# Patient Record
Sex: Male | Born: 1950 | Race: White | Hispanic: No | Marital: Married | Smoking: Current every day smoker
Health system: Southern US, Community
[De-identification: ages and names within clinical notes are randomized; demographics above are authoritative.]

## PROBLEM LIST (undated history)

## (undated) DIAGNOSIS — I219 Acute myocardial infarction, unspecified: Secondary | ICD-10-CM

## (undated) DIAGNOSIS — T148XXA Other injury of unspecified body region, initial encounter: Secondary | ICD-10-CM

## (undated) DIAGNOSIS — I10 Essential (primary) hypertension: Secondary | ICD-10-CM

## (undated) DIAGNOSIS — R011 Cardiac murmur, unspecified: Secondary | ICD-10-CM

## (undated) DIAGNOSIS — E7800 Pure hypercholesterolemia, unspecified: Secondary | ICD-10-CM

## (undated) DIAGNOSIS — E039 Hypothyroidism, unspecified: Secondary | ICD-10-CM

## (undated) DIAGNOSIS — M1990 Unspecified osteoarthritis, unspecified site: Secondary | ICD-10-CM

## (undated) DIAGNOSIS — E119 Type 2 diabetes mellitus without complications: Secondary | ICD-10-CM

## (undated) DIAGNOSIS — J449 Chronic obstructive pulmonary disease, unspecified: Secondary | ICD-10-CM

## (undated) DIAGNOSIS — I2510 Atherosclerotic heart disease of native coronary artery without angina pectoris: Secondary | ICD-10-CM

## (undated) HISTORY — PX: JOINT REPLACEMENT: SHX530

## (undated) HISTORY — PX: APPENDECTOMY: SHX54

## (undated) HISTORY — PX: EYE SURGERY: SHX253

## (undated) HISTORY — PX: CORONARY ARTERY BYPASS GRAFT: SHX141

## (undated) HISTORY — PX: HERNIA REPAIR: SHX51

## (undated) HISTORY — PX: CARDIAC VALVE REPLACEMENT: SHX585

## (undated) HISTORY — PX: TONSILLECTOMY: SUR1361

## (undated) HISTORY — PX: CATARACT EXTRACTION: SUR2

## (undated) HISTORY — PX: CAROTID ENDARTERECTOMY: SUR193

## (undated) HISTORY — PX: CARDIAC CATHETERIZATION: SHX172

---

## 2012-02-13 ENCOUNTER — Encounter (INDEPENDENT_AMBULATORY_CARE_PROVIDER_SITE_OTHER): Payer: Medicare Other | Admitting: Ophthalmology

## 2012-02-13 DIAGNOSIS — E1165 Type 2 diabetes mellitus with hyperglycemia: Secondary | ICD-10-CM

## 2012-02-13 DIAGNOSIS — H2510 Age-related nuclear cataract, unspecified eye: Secondary | ICD-10-CM

## 2012-02-13 DIAGNOSIS — I10 Essential (primary) hypertension: Secondary | ICD-10-CM

## 2012-02-13 DIAGNOSIS — E11359 Type 2 diabetes mellitus with proliferative diabetic retinopathy without macular edema: Secondary | ICD-10-CM

## 2012-02-13 DIAGNOSIS — H35039 Hypertensive retinopathy, unspecified eye: Secondary | ICD-10-CM

## 2012-02-13 DIAGNOSIS — E11319 Type 2 diabetes mellitus with unspecified diabetic retinopathy without macular edema: Secondary | ICD-10-CM

## 2012-02-13 DIAGNOSIS — H43819 Vitreous degeneration, unspecified eye: Secondary | ICD-10-CM

## 2012-02-17 ENCOUNTER — Encounter (HOSPITAL_COMMUNITY): Admission: RE | Payer: Self-pay | Source: Ambulatory Visit

## 2012-02-17 ENCOUNTER — Ambulatory Visit (HOSPITAL_COMMUNITY): Admission: RE | Admit: 2012-02-17 | Payer: Medicare Other | Source: Ambulatory Visit | Admitting: Ophthalmology

## 2012-02-17 SURGERY — SCLERAL BUCKLE
Anesthesia: General | Laterality: Left

## 2012-06-16 ENCOUNTER — Ambulatory Visit (INDEPENDENT_AMBULATORY_CARE_PROVIDER_SITE_OTHER): Payer: Medicare Other | Admitting: Ophthalmology

## 2012-06-16 DIAGNOSIS — E11359 Type 2 diabetes mellitus with proliferative diabetic retinopathy without macular edema: Secondary | ICD-10-CM

## 2012-06-16 DIAGNOSIS — H35039 Hypertensive retinopathy, unspecified eye: Secondary | ICD-10-CM

## 2012-06-16 DIAGNOSIS — E1165 Type 2 diabetes mellitus with hyperglycemia: Secondary | ICD-10-CM

## 2012-06-16 DIAGNOSIS — E11319 Type 2 diabetes mellitus with unspecified diabetic retinopathy without macular edema: Secondary | ICD-10-CM

## 2012-06-16 DIAGNOSIS — I10 Essential (primary) hypertension: Secondary | ICD-10-CM

## 2012-06-16 DIAGNOSIS — H2510 Age-related nuclear cataract, unspecified eye: Secondary | ICD-10-CM

## 2012-06-16 DIAGNOSIS — H43819 Vitreous degeneration, unspecified eye: Secondary | ICD-10-CM

## 2012-06-16 DIAGNOSIS — D3130 Benign neoplasm of unspecified choroid: Secondary | ICD-10-CM

## 2012-12-20 ENCOUNTER — Ambulatory Visit (INDEPENDENT_AMBULATORY_CARE_PROVIDER_SITE_OTHER): Payer: Medicare Other | Admitting: Ophthalmology

## 2012-12-20 DIAGNOSIS — E11319 Type 2 diabetes mellitus with unspecified diabetic retinopathy without macular edema: Secondary | ICD-10-CM

## 2012-12-20 DIAGNOSIS — H35039 Hypertensive retinopathy, unspecified eye: Secondary | ICD-10-CM

## 2012-12-20 DIAGNOSIS — I10 Essential (primary) hypertension: Secondary | ICD-10-CM

## 2012-12-20 DIAGNOSIS — E1139 Type 2 diabetes mellitus with other diabetic ophthalmic complication: Secondary | ICD-10-CM

## 2012-12-20 DIAGNOSIS — E11359 Type 2 diabetes mellitus with proliferative diabetic retinopathy without macular edema: Secondary | ICD-10-CM

## 2012-12-20 DIAGNOSIS — D3130 Benign neoplasm of unspecified choroid: Secondary | ICD-10-CM

## 2013-05-10 ENCOUNTER — Encounter (INDEPENDENT_AMBULATORY_CARE_PROVIDER_SITE_OTHER): Payer: Medicare Other | Admitting: Ophthalmology

## 2013-05-10 DIAGNOSIS — D3130 Benign neoplasm of unspecified choroid: Secondary | ICD-10-CM

## 2013-05-10 DIAGNOSIS — H34239 Retinal artery branch occlusion, unspecified eye: Secondary | ICD-10-CM

## 2013-05-10 DIAGNOSIS — H35039 Hypertensive retinopathy, unspecified eye: Secondary | ICD-10-CM

## 2013-05-10 DIAGNOSIS — E11319 Type 2 diabetes mellitus with unspecified diabetic retinopathy without macular edema: Secondary | ICD-10-CM

## 2013-05-10 DIAGNOSIS — E1165 Type 2 diabetes mellitus with hyperglycemia: Secondary | ICD-10-CM

## 2013-05-10 DIAGNOSIS — I10 Essential (primary) hypertension: Secondary | ICD-10-CM

## 2013-05-10 DIAGNOSIS — E1139 Type 2 diabetes mellitus with other diabetic ophthalmic complication: Secondary | ICD-10-CM

## 2013-05-10 DIAGNOSIS — H26499 Other secondary cataract, unspecified eye: Secondary | ICD-10-CM

## 2013-05-20 ENCOUNTER — Ambulatory Visit (INDEPENDENT_AMBULATORY_CARE_PROVIDER_SITE_OTHER): Payer: Medicare Other | Admitting: Ophthalmology

## 2013-05-20 DIAGNOSIS — H34239 Retinal artery branch occlusion, unspecified eye: Secondary | ICD-10-CM

## 2013-05-20 DIAGNOSIS — H2700 Aphakia, unspecified eye: Secondary | ICD-10-CM

## 2013-06-20 ENCOUNTER — Ambulatory Visit (INDEPENDENT_AMBULATORY_CARE_PROVIDER_SITE_OTHER): Payer: Medicare Other | Admitting: Ophthalmology

## 2013-10-12 ENCOUNTER — Encounter (INDEPENDENT_AMBULATORY_CARE_PROVIDER_SITE_OTHER): Payer: Medicare Other | Admitting: Ophthalmology

## 2013-10-12 DIAGNOSIS — H35039 Hypertensive retinopathy, unspecified eye: Secondary | ICD-10-CM

## 2013-10-12 DIAGNOSIS — E11359 Type 2 diabetes mellitus with proliferative diabetic retinopathy without macular edema: Secondary | ICD-10-CM

## 2013-10-12 DIAGNOSIS — E1165 Type 2 diabetes mellitus with hyperglycemia: Secondary | ICD-10-CM

## 2013-10-12 DIAGNOSIS — I10 Essential (primary) hypertension: Secondary | ICD-10-CM

## 2013-10-12 DIAGNOSIS — H4310 Vitreous hemorrhage, unspecified eye: Secondary | ICD-10-CM

## 2013-10-12 DIAGNOSIS — E11319 Type 2 diabetes mellitus with unspecified diabetic retinopathy without macular edema: Secondary | ICD-10-CM

## 2013-10-12 DIAGNOSIS — E1139 Type 2 diabetes mellitus with other diabetic ophthalmic complication: Secondary | ICD-10-CM

## 2013-10-12 DIAGNOSIS — H43819 Vitreous degeneration, unspecified eye: Secondary | ICD-10-CM

## 2013-10-24 NOTE — H&P (Signed)
Christian Gibson is an 63 y.o. male.   Chief Complaint:loss of vision 2 weeks ago left eye HPI: Diabetic with vitreous hemorrhage two weeks ago,  Left eye  No past medical history on file.  No past surgical history on file.  No family history on file. Social History:  has no tobacco, alcohol, and drug history on file.  Allergies: Allergies not on file  No prescriptions prior to admission    Review of systems otherwise negative  There were no vitals taken for this visit.  Physical exam: Mental status: oriented x3. Eyes: See eye exam associated with this date of surgery in media tab.  Scanned in by scanning center Ears, Nose, Throat: within normal limits Neck: Within Normal limits General: within normal limits Chest: Within normal limits Breast: deferred Heart: Within normal limits Abdomen: Within normal limits GU: deferred Extremities: within normal limits Skin: within normal limits  Assessment/Plan Vitreous hemorrhage, left eye  Proliferative diabetic retinopathy left eye Plan: To [facility] for Pars plana vitrectomy, laser, membrane peel, gas injection left eye.  Christian Gibson 10/24/2013, 5:16 PM

## 2013-11-07 ENCOUNTER — Encounter (HOSPITAL_COMMUNITY): Payer: Self-pay | Admitting: Pharmacy Technician

## 2013-11-09 ENCOUNTER — Encounter (HOSPITAL_COMMUNITY): Payer: Self-pay | Admitting: *Deleted

## 2013-11-09 ENCOUNTER — Encounter (HOSPITAL_COMMUNITY): Payer: Self-pay | Admitting: Certified Registered Nurse Anesthetist

## 2013-11-09 MED ORDER — CEFAZOLIN SODIUM-DEXTROSE 2-3 GM-% IV SOLR: 2.0000 g | INTRAVENOUS | Status: DC

## 2013-11-09 NOTE — Progress Notes (Signed)
11/09/13 1809  OBSTRUCTIVE SLEEP APNEA  Have you ever been diagnosed with sleep apnea through a sleep study? No  Do you snore loudly (loud enough to be heard through closed doors)?  0  Do you often feel tired, fatigued, or sleepy during the daytime? 1  Has anyone observed you stop breathing during your sleep? 0  Do you have, or are you being treated for high blood pressure? 1  Age over 65 years old? 1  Gender: 1  Obstructive Sleep Apnea Score 4

## 2013-11-10 ENCOUNTER — Ambulatory Visit (HOSPITAL_COMMUNITY): Admission: RE | Admit: 2013-11-10 | Payer: Medicare Other | Source: Ambulatory Visit | Admitting: Ophthalmology

## 2013-11-10 ENCOUNTER — Encounter (INDEPENDENT_AMBULATORY_CARE_PROVIDER_SITE_OTHER): Payer: Medicare Other | Admitting: Ophthalmology

## 2013-11-10 DIAGNOSIS — H4312 Vitreous hemorrhage, left eye: Secondary | ICD-10-CM

## 2013-11-10 DIAGNOSIS — H2102 Hyphema, left eye: Secondary | ICD-10-CM

## 2013-11-10 DIAGNOSIS — H35033 Hypertensive retinopathy, bilateral: Secondary | ICD-10-CM

## 2013-11-10 DIAGNOSIS — E11351 Type 2 diabetes mellitus with proliferative diabetic retinopathy with macular edema: Secondary | ICD-10-CM

## 2013-11-10 DIAGNOSIS — E11329 Type 2 diabetes mellitus with mild nonproliferative diabetic retinopathy without macular edema: Secondary | ICD-10-CM

## 2013-11-10 DIAGNOSIS — H31412 Hemorrhagic choroidal detachment, left eye: Secondary | ICD-10-CM

## 2013-11-10 DIAGNOSIS — E11311 Type 2 diabetes mellitus with unspecified diabetic retinopathy with macular edema: Secondary | ICD-10-CM

## 2013-11-10 HISTORY — DX: Essential (primary) hypertension: I10

## 2013-11-10 HISTORY — DX: Acute myocardial infarction, unspecified: I21.9

## 2013-11-10 HISTORY — DX: Type 2 diabetes mellitus without complications: E11.9

## 2013-11-10 HISTORY — DX: Pure hypercholesterolemia, unspecified: E78.00

## 2013-11-10 HISTORY — DX: Atherosclerotic heart disease of native coronary artery without angina pectoris: I25.10

## 2013-11-10 HISTORY — DX: Unspecified osteoarthritis, unspecified site: M19.90

## 2013-11-10 HISTORY — DX: Cardiac murmur, unspecified: R01.1

## 2013-11-10 HISTORY — DX: Chronic obstructive pulmonary disease, unspecified: J44.9

## 2013-11-10 HISTORY — DX: Other injury of unspecified body region, initial encounter: T14.8XXA

## 2013-11-10 HISTORY — DX: Hypothyroidism, unspecified: E03.9

## 2013-11-10 SURGERY — PARS PLANA VITRECTOMY WITH 25 GAUGE
Anesthesia: General | Laterality: Left

## 2013-11-10 NOTE — H&P (Signed)
I examined the patient today and there is no change in the medical status 

## 2013-11-14 ENCOUNTER — Encounter (INDEPENDENT_AMBULATORY_CARE_PROVIDER_SITE_OTHER): Payer: Medicare Other | Admitting: Ophthalmology

## 2013-11-14 DIAGNOSIS — E11311 Type 2 diabetes mellitus with unspecified diabetic retinopathy with macular edema: Secondary | ICD-10-CM

## 2013-11-14 DIAGNOSIS — E11351 Type 2 diabetes mellitus with proliferative diabetic retinopathy with macular edema: Secondary | ICD-10-CM

## 2013-11-14 DIAGNOSIS — E11329 Type 2 diabetes mellitus with mild nonproliferative diabetic retinopathy without macular edema: Secondary | ICD-10-CM

## 2013-11-14 DIAGNOSIS — H2102 Hyphema, left eye: Secondary | ICD-10-CM

## 2013-11-14 DIAGNOSIS — I10 Essential (primary) hypertension: Secondary | ICD-10-CM

## 2013-11-14 DIAGNOSIS — H43813 Vitreous degeneration, bilateral: Secondary | ICD-10-CM

## 2013-11-14 DIAGNOSIS — H2511 Age-related nuclear cataract, right eye: Secondary | ICD-10-CM

## 2013-11-14 DIAGNOSIS — H35032 Hypertensive retinopathy, left eye: Secondary | ICD-10-CM

## 2013-11-14 DIAGNOSIS — H31412 Hemorrhagic choroidal detachment, left eye: Secondary | ICD-10-CM

## 2013-11-17 ENCOUNTER — Encounter (INDEPENDENT_AMBULATORY_CARE_PROVIDER_SITE_OTHER): Payer: Medicare Other | Admitting: Ophthalmology

## 2013-11-17 DIAGNOSIS — E11351 Type 2 diabetes mellitus with proliferative diabetic retinopathy with macular edema: Secondary | ICD-10-CM

## 2013-11-17 DIAGNOSIS — H4312 Vitreous hemorrhage, left eye: Secondary | ICD-10-CM

## 2013-11-17 DIAGNOSIS — E11311 Type 2 diabetes mellitus with unspecified diabetic retinopathy with macular edema: Secondary | ICD-10-CM

## 2013-11-17 DIAGNOSIS — E11329 Type 2 diabetes mellitus with mild nonproliferative diabetic retinopathy without macular edema: Secondary | ICD-10-CM

## 2013-11-17 DIAGNOSIS — H2102 Hyphema, left eye: Secondary | ICD-10-CM

## 2013-11-17 DIAGNOSIS — H31412 Hemorrhagic choroidal detachment, left eye: Secondary | ICD-10-CM

## 2013-11-17 DIAGNOSIS — H43813 Vitreous degeneration, bilateral: Secondary | ICD-10-CM

## 2013-11-21 ENCOUNTER — Ambulatory Visit (INDEPENDENT_AMBULATORY_CARE_PROVIDER_SITE_OTHER): Payer: Medicare Other | Admitting: Ophthalmology

## 2013-11-21 ENCOUNTER — Encounter (INDEPENDENT_AMBULATORY_CARE_PROVIDER_SITE_OTHER): Payer: Medicare Other | Admitting: Ophthalmology

## 2013-11-21 DIAGNOSIS — H43813 Vitreous degeneration, bilateral: Secondary | ICD-10-CM

## 2013-11-21 DIAGNOSIS — E11329 Type 2 diabetes mellitus with mild nonproliferative diabetic retinopathy without macular edema: Secondary | ICD-10-CM

## 2013-11-21 DIAGNOSIS — H4312 Vitreous hemorrhage, left eye: Secondary | ICD-10-CM

## 2013-11-21 DIAGNOSIS — E11311 Type 2 diabetes mellitus with unspecified diabetic retinopathy with macular edema: Secondary | ICD-10-CM

## 2013-11-21 DIAGNOSIS — H31412 Hemorrhagic choroidal detachment, left eye: Secondary | ICD-10-CM

## 2013-11-21 DIAGNOSIS — H2102 Hyphema, left eye: Secondary | ICD-10-CM

## 2013-11-21 DIAGNOSIS — H35033 Hypertensive retinopathy, bilateral: Secondary | ICD-10-CM

## 2013-11-21 DIAGNOSIS — E11351 Type 2 diabetes mellitus with proliferative diabetic retinopathy with macular edema: Secondary | ICD-10-CM

## 2013-11-24 ENCOUNTER — Encounter (INDEPENDENT_AMBULATORY_CARE_PROVIDER_SITE_OTHER): Payer: Medicare Other | Admitting: Ophthalmology

## 2013-11-24 DIAGNOSIS — H2102 Hyphema, left eye: Secondary | ICD-10-CM

## 2013-11-28 ENCOUNTER — Encounter (INDEPENDENT_AMBULATORY_CARE_PROVIDER_SITE_OTHER): Payer: Medicare Other | Admitting: Ophthalmology

## 2013-11-28 DIAGNOSIS — H35033 Hypertensive retinopathy, bilateral: Secondary | ICD-10-CM

## 2013-11-28 DIAGNOSIS — I10 Essential (primary) hypertension: Secondary | ICD-10-CM

## 2013-11-28 DIAGNOSIS — E11329 Type 2 diabetes mellitus with mild nonproliferative diabetic retinopathy without macular edema: Secondary | ICD-10-CM

## 2013-11-28 DIAGNOSIS — E11311 Type 2 diabetes mellitus with unspecified diabetic retinopathy with macular edema: Secondary | ICD-10-CM

## 2013-11-28 DIAGNOSIS — H4312 Vitreous hemorrhage, left eye: Secondary | ICD-10-CM

## 2013-11-28 DIAGNOSIS — H43813 Vitreous degeneration, bilateral: Secondary | ICD-10-CM

## 2013-11-28 DIAGNOSIS — H2102 Hyphema, left eye: Secondary | ICD-10-CM

## 2013-11-28 DIAGNOSIS — E11351 Type 2 diabetes mellitus with proliferative diabetic retinopathy with macular edema: Secondary | ICD-10-CM

## 2013-12-07 ENCOUNTER — Ambulatory Visit (INDEPENDENT_AMBULATORY_CARE_PROVIDER_SITE_OTHER): Payer: Medicare Other | Admitting: Ophthalmology

## 2013-12-07 DIAGNOSIS — E11351 Type 2 diabetes mellitus with proliferative diabetic retinopathy with macular edema: Secondary | ICD-10-CM

## 2013-12-07 DIAGNOSIS — E11329 Type 2 diabetes mellitus with mild nonproliferative diabetic retinopathy without macular edema: Secondary | ICD-10-CM

## 2013-12-07 DIAGNOSIS — H2102 Hyphema, left eye: Secondary | ICD-10-CM

## 2013-12-07 DIAGNOSIS — E11319 Type 2 diabetes mellitus with unspecified diabetic retinopathy without macular edema: Secondary | ICD-10-CM

## 2013-12-07 DIAGNOSIS — H4312 Vitreous hemorrhage, left eye: Secondary | ICD-10-CM

## 2013-12-21 ENCOUNTER — Ambulatory Visit (INDEPENDENT_AMBULATORY_CARE_PROVIDER_SITE_OTHER): Payer: Medicare Other | Admitting: Ophthalmology

## 2013-12-21 DIAGNOSIS — H43813 Vitreous degeneration, bilateral: Secondary | ICD-10-CM

## 2013-12-21 DIAGNOSIS — H2102 Hyphema, left eye: Secondary | ICD-10-CM

## 2013-12-21 DIAGNOSIS — H2511 Age-related nuclear cataract, right eye: Secondary | ICD-10-CM

## 2013-12-21 DIAGNOSIS — H43812 Vitreous degeneration, left eye: Secondary | ICD-10-CM

## 2013-12-21 DIAGNOSIS — E11311 Type 2 diabetes mellitus with unspecified diabetic retinopathy with macular edema: Secondary | ICD-10-CM

## 2013-12-21 DIAGNOSIS — E11351 Type 2 diabetes mellitus with proliferative diabetic retinopathy with macular edema: Secondary | ICD-10-CM

## 2013-12-21 DIAGNOSIS — E10339 Type 1 diabetes mellitus with moderate nonproliferative diabetic retinopathy without macular edema: Secondary | ICD-10-CM

## 2013-12-21 DIAGNOSIS — H3531 Nonexudative age-related macular degeneration: Secondary | ICD-10-CM

## 2014-01-25 ENCOUNTER — Encounter (INDEPENDENT_AMBULATORY_CARE_PROVIDER_SITE_OTHER): Payer: Medicare Other | Admitting: Ophthalmology

## 2014-01-25 DIAGNOSIS — D3131 Benign neoplasm of right choroid: Secondary | ICD-10-CM

## 2014-01-25 DIAGNOSIS — E11311 Type 2 diabetes mellitus with unspecified diabetic retinopathy with macular edema: Secondary | ICD-10-CM

## 2014-01-25 DIAGNOSIS — H4312 Vitreous hemorrhage, left eye: Secondary | ICD-10-CM

## 2014-01-25 DIAGNOSIS — E11351 Type 2 diabetes mellitus with proliferative diabetic retinopathy with macular edema: Secondary | ICD-10-CM

## 2014-01-25 DIAGNOSIS — I10 Essential (primary) hypertension: Secondary | ICD-10-CM

## 2014-01-25 DIAGNOSIS — H2102 Hyphema, left eye: Secondary | ICD-10-CM

## 2014-01-25 DIAGNOSIS — H35031 Hypertensive retinopathy, right eye: Secondary | ICD-10-CM

## 2014-01-25 DIAGNOSIS — E11329 Type 2 diabetes mellitus with mild nonproliferative diabetic retinopathy without macular edema: Secondary | ICD-10-CM

## 2014-02-20 ENCOUNTER — Encounter (INDEPENDENT_AMBULATORY_CARE_PROVIDER_SITE_OTHER): Payer: Medicare Other | Admitting: Ophthalmology

## 2014-02-20 DIAGNOSIS — H43813 Vitreous degeneration, bilateral: Secondary | ICD-10-CM

## 2014-02-20 DIAGNOSIS — I10 Essential (primary) hypertension: Secondary | ICD-10-CM

## 2014-02-20 DIAGNOSIS — E11311 Type 2 diabetes mellitus with unspecified diabetic retinopathy with macular edema: Secondary | ICD-10-CM

## 2014-02-20 DIAGNOSIS — E11351 Type 2 diabetes mellitus with proliferative diabetic retinopathy with macular edema: Secondary | ICD-10-CM

## 2014-02-20 DIAGNOSIS — D3131 Benign neoplasm of right choroid: Secondary | ICD-10-CM

## 2014-02-20 DIAGNOSIS — H2511 Age-related nuclear cataract, right eye: Secondary | ICD-10-CM

## 2014-02-20 DIAGNOSIS — H4312 Vitreous hemorrhage, left eye: Secondary | ICD-10-CM

## 2014-02-20 DIAGNOSIS — H31411 Hemorrhagic choroidal detachment, right eye: Secondary | ICD-10-CM

## 2014-02-20 DIAGNOSIS — H35033 Hypertensive retinopathy, bilateral: Secondary | ICD-10-CM

## 2014-02-20 DIAGNOSIS — E11339 Type 2 diabetes mellitus with moderate nonproliferative diabetic retinopathy without macular edema: Secondary | ICD-10-CM

## 2014-02-26 ENCOUNTER — Encounter (HOSPITAL_COMMUNITY): Payer: Self-pay | Admitting: Cardiology

## 2014-02-26 ENCOUNTER — Emergency Department (HOSPITAL_COMMUNITY)
Admission: EM | Admit: 2014-02-26 | Discharge: 2014-02-26 | Disposition: A | Discharge status: Home / Self Care | Payer: Medicare Other | Attending: Emergency Medicine | Admitting: Emergency Medicine

## 2014-02-26 ENCOUNTER — Emergency Department (HOSPITAL_COMMUNITY): Payer: Medicare Other

## 2014-02-26 DIAGNOSIS — Z720 Tobacco use: Secondary | ICD-10-CM | POA: Diagnosis not present

## 2014-02-26 DIAGNOSIS — Z9889 Other specified postprocedural states: Secondary | ICD-10-CM | POA: Diagnosis not present

## 2014-02-26 DIAGNOSIS — R011 Cardiac murmur, unspecified: Secondary | ICD-10-CM | POA: Insufficient documentation

## 2014-02-26 DIAGNOSIS — Z8739 Personal history of other diseases of the musculoskeletal system and connective tissue: Secondary | ICD-10-CM | POA: Insufficient documentation

## 2014-02-26 DIAGNOSIS — Z7952 Long term (current) use of systemic steroids: Secondary | ICD-10-CM | POA: Insufficient documentation

## 2014-02-26 DIAGNOSIS — H5461 Unqualified visual loss, right eye, normal vision left eye: Secondary | ICD-10-CM | POA: Diagnosis present

## 2014-02-26 DIAGNOSIS — I2510 Atherosclerotic heart disease of native coronary artery without angina pectoris: Secondary | ICD-10-CM | POA: Diagnosis not present

## 2014-02-26 DIAGNOSIS — E119 Type 2 diabetes mellitus without complications: Secondary | ICD-10-CM | POA: Insufficient documentation

## 2014-02-26 DIAGNOSIS — H3561 Retinal hemorrhage, right eye: Secondary | ICD-10-CM | POA: Diagnosis not present

## 2014-02-26 DIAGNOSIS — I252 Old myocardial infarction: Secondary | ICD-10-CM | POA: Insufficient documentation

## 2014-02-26 DIAGNOSIS — Z7901 Long term (current) use of anticoagulants: Secondary | ICD-10-CM | POA: Insufficient documentation

## 2014-02-26 DIAGNOSIS — H3321 Serous retinal detachment, right eye: Secondary | ICD-10-CM | POA: Diagnosis not present

## 2014-02-26 DIAGNOSIS — Z79899 Other long term (current) drug therapy: Secondary | ICD-10-CM | POA: Insufficient documentation

## 2014-02-26 DIAGNOSIS — R51 Headache: Secondary | ICD-10-CM | POA: Insufficient documentation

## 2014-02-26 DIAGNOSIS — E039 Hypothyroidism, unspecified: Secondary | ICD-10-CM | POA: Insufficient documentation

## 2014-02-26 DIAGNOSIS — I10 Essential (primary) hypertension: Secondary | ICD-10-CM | POA: Insufficient documentation

## 2014-02-26 LAB — CBC WITH DIFFERENTIAL/PLATELET
Basophils Absolute: 0.1 10*3/uL (ref 0.0–0.1)
Basophils Relative: 1 % (ref 0–1)
Eosinophils Absolute: 0.2 10*3/uL (ref 0.0–0.7)
Eosinophils Relative: 2 % (ref 0–5)
HCT: 45.7 % (ref 39.0–52.0)
Hemoglobin: 15.5 g/dL (ref 13.0–17.0)
Lymphocytes Relative: 10 % — ABNORMAL LOW (ref 12–46)
Lymphs Abs: 0.8 10*3/uL (ref 0.7–4.0)
MCH: 29.3 pg (ref 26.0–34.0)
MCHC: 33.9 g/dL (ref 30.0–36.0)
MCV: 86.4 fL (ref 78.0–100.0)
Monocytes Absolute: 0.5 10*3/uL (ref 0.1–1.0)
Monocytes Relative: 6 % (ref 3–12)
NEUTROS ABS: 6.6 10*3/uL (ref 1.7–7.7)
NEUTROS PCT: 81 % — AB (ref 43–77)
Platelets: 150 10*3/uL (ref 150–400)
RBC: 5.29 MIL/uL (ref 4.22–5.81)
RDW: 15.1 % (ref 11.5–15.5)
WBC: 8.1 10*3/uL (ref 4.0–10.5)

## 2014-02-26 LAB — BASIC METABOLIC PANEL
Anion gap: 8 (ref 5–15)
BUN: 12 mg/dL (ref 6–23)
CALCIUM: 8.9 mg/dL (ref 8.4–10.5)
CO2: 26 mmol/L (ref 19–32)
CREATININE: 0.78 mg/dL (ref 0.50–1.35)
Chloride: 102 mmol/L (ref 96–112)
GFR calc non Af Amer: >90 mL/min (ref >90)
Glucose, Bld: 147 mg/dL — ABNORMAL HIGH (ref 70–99)
Potassium: 4.3 mmol/L (ref 3.5–5.1)
SODIUM: 136 mmol/L (ref 135–145)

## 2014-02-26 LAB — PROTIME-INR
INR: 2.91 — ABNORMAL HIGH (ref 0.00–1.49)
Prothrombin Time: 30.6 seconds — ABNORMAL HIGH (ref 11.6–15.2)

## 2014-02-26 MED ORDER — TETRACAINE HCL 0.5 % OP SOLN
2.0000 [drp] | Freq: Once | OPHTHALMIC | Status: DC
  Filled 2014-02-26: qty 2

## 2014-02-26 MED ORDER — DEXTROSE 5 % IV SOLN
5.0000 mg | INTRAVENOUS | Status: AC
  Administered 2014-02-26: 5 mg via INTRAVENOUS
  Filled 2014-02-26: qty 0.5

## 2014-02-26 NOTE — Discharge Instructions (Signed)
See your eye doctor tomorrow morning. See your cardiologist in the next 48 hours and your hematologist. Stop taking Coumadin for the next 3 days and then per recommendations of your specialists. Avoid anything that will increase pressure for example coughing, sneezing, leaning forward with your head below your waist.  If you were given medicines take as directed.  If you are on coumadin or contraceptives realize their levels and effectiveness is altered by many different medicines.  If you have any reaction (rash, tongues swelling, other) to the medicines stop taking and see a physician.   Please follow up as directed and return to the ER or see a physician for new or worsening symptoms.  Thank you. Filed Vitals:   02/26/14 1349 02/26/14 1435 02/26/14 1600 02/26/14 1654  BP: 139/61 134/57 152/36 106/58  Pulse: 65 56 71 70  Temp: 98 F (36.7 C)     TempSrc: Oral     Resp: 19 16  16   SpO2: 92% 96% 94% 90%

## 2014-02-26 NOTE — ED Notes (Signed)
Pt reports he normally does not have vision in the left eye, but woke up yesterday and could not see out of the right eye. States he can see shadows at times. Concerned for a retinal detachment

## 2014-02-26 NOTE — ED Provider Notes (Addendum)
CSN: [id number]     Arrival date & time 02/26/14  1342 History   First MD Initiated Contact with Patient 02/26/14 1507     Chief Complaint  Patient presents with  . 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 Patient had mild areas/floaters of decreased vision that progressed up blotches and then earlier today hardly any vision in the right eye. This is similar to when he lost vision in his left eye. Patient has been following up with hematologist, cardiologist and ophthalmologist in being monitored closely. Patient recently was taken off aspirin to see without help. No pain in the eye no glaucoma history. Patient is on Coumadin  The history is provided by the patient.    Past Medical History  Diagnosis Date  . Hypertension   . Diabetes mellitus without complication   . Hypercholesteremia   . COPD (chronic obstructive pulmonary disease)   . Nerve damage     right hand  . Coronary artery disease   . Myocardial infarction   . Heart murmur   . Hypothyroidism   . Arthritis    Past Surgical History  Procedure Laterality Date  . Appendectomy    . Carotid endarterectomy      left carotid artery  . Cardiac catheterization    . Cardiac valve replacement      aortic  . Cataract extraction      left eye  . Eye surgery    . Tonsillectomy    . Hernia repair     Family History  Problem Relation Age of Onset  . Stroke Mother   . Cancer Father   . Cancer Other   . Hypertension Other   . Diabetes Other    History  Substance Use Topics  . Smoking status: Current Every Day Smoker -- 1.00 packs/day    Types: Cigarettes  . Smokeless tobacco: Never Used  . Alcohol Use: Yes     Comment:  "beer occassionally"    Review of Systems  Constitutional: Negative for fever and chills.  HENT: Negative for congestion.   Eyes: Positive for visual disturbance.  Respiratory: Negative for shortness of breath.   Cardiovascular: Negative for chest pain.  Gastrointestinal: Negative for vomiting and abdominal pain.  Genitourinary: Negative for dysuria and flank pain.  Musculoskeletal: Negative for back pain, neck pain and neck stiffness.  Skin: Negative for rash.  Neurological: Negative for light-headedness and headaches.      Allergies  Review of patient's allergies indicates no known allergies.  Home Medications   Prior to Admission medications   Medication Sig Start Date End Date Taking? Authorizing Provider  BREO ELLIPTA 100-25 MCG/INH AEPB Take 1 puff by mouth daily.  09/13/13  Yes Historical Provider, MD  Cyanocobalamin (RA VITAMIN B-12 TR) 1000 MCG TBCR Take 1 tablet by mouth daily.    Yes Historical Provider, MD  glimepiride (AMARYL) 1 MG tablet Take 1 mg by mouth daily with breakfast.  09/20/13  Yes Historical Provider, MD  ibuprofen (ADVIL,MOTRIN) 200 MG tablet Take 400 mg by mouth daily as needed for mild pain.   Yes Historical Provider, MD  KOMBIGLYZE XR 2.05-998 MG TB24 Take 1 tablet by mouth 2 (two) times daily.  08/22/13  Yes Historical Provider, MD  levothyroxine (SYNTHROID, LEVOTHROID) 25 MCG tablet  Take 12.5 mcg by mouth daily before breakfast.  09/09/13  Yes Historical Provider, MD  lisinopril (PRINIVIL,ZESTRIL) 10 MG tablet Take 10 mg by mouth daily.  09/21/13  Yes Historical Provider, MD  LYRICA 75 MG capsule Take 75 mg by mouth 2 (two) times daily.  09/07/13  Yes Historical Provider, MD  metoprolol (LOPRESSOR) 50 MG tablet Take 50 mg by mouth 2 (two) times daily.  10/07/13  Yes Historical Provider, MD  prednisoLONE acetate (PRED FORTE) 1 % ophthalmic suspension Place 1 drop into the left eye daily.   Yes Historical Provider, MD  rosuvastatin (CRESTOR) 20 MG tablet Take 20  mg by mouth every evening.  01/17/13  Yes Historical Provider, MD  SPIRIVA HANDIHALER 18 MCG inhalation capsule Place 18 mcg into inhaler and inhale daily.  08/22/13  Yes Historical Provider, MD  Vitamin D, Ergocalciferol, (DRISDOL) 50000 UNITS CAPS capsule Take 50,000 Units by mouth every 7 (seven) days. mondays   Yes Historical Provider, MD  warfarin (COUMADIN) 5 MG tablet Take 2.5-5 mg by mouth daily. Take half of a tablet (2.5mg ) on Mondays and Fridays, and a whole tablet (5mg ) on all other days. 09/07/13  Yes Historical Provider, MD   BP 159/68 mmHg  Pulse 72  Temp(Src) 98 F (36.7 C) (Oral)  Resp 16  SpO2 92% Physical Exam  Constitutional: He is oriented to person, place, and time. He appears well-developed and well-nourished.  HENT:  Head: Normocephalic and atraumatic.  Eyes: Right eye exhibits no discharge. Left eye exhibits no discharge.  Patient has asymmetric dilated pupil on the left with no vision to fingers. Patient has minimally responsive 2-3 mm pupil on the right, no detection in all visual fields to fingers, patient does sense light sensation. Unable to see in the back the retina on attempt. Muscle function intact bilateral. No swelling around the eye.  Neck: Normal range of motion. Neck supple. No tracheal deviation present.  Cardiovascular: Normal rate and regular rhythm.   Murmur (click from valve replacement appreciated upper sternum) heard. Pulmonary/Chest: Effort normal and breath sounds normal.  Abdominal: Soft. He exhibits no distension. There is no tenderness. There is no guarding.  Musculoskeletal: He exhibits no edema.  Neurological: He is alert and oriented to person, place, and time.  Skin: Skin is warm. No rash noted.  Psychiatric: He has a normal mood and affect.  Nursing note and vitals reviewed.   ED Course  Procedures (including critical care time) CRITICAL CARE Performed by: Mariea Clonts   Total critical care time: 75 min  Critical care  time was exclusive of separately billable procedures and treating other patients.  Critical care was necessary to treat or prevent imminent or life-threatening deterioration.  Critical care was time spent personally by me on the following activities: development of treatment plan with patient and/or surrogate as well as nursing, discussions with consultants, evaluation of patient's response to treatment, examination of patient, obtaining history from patient or surrogate, ordering and performing treatments and interventions, ordering and review of laboratory studies, ordering and review of radiographic studies, pulse oximetry and re-evaluation of patient's condition. Ultrasound: Limited Ocular  Performed and interpreted by Dr Reather Converse Indication: vision loss Using high frequency linear probe, ultrasound of the globe was performed in real time in two planes with patient looking left and right.  Interpretation: right retinal detachment visualized.  Lens was in proper location.  Subretinal/ choroidal hemorrhage appreciated.  Images were electronically archived.     Labs Review Labs Reviewed  CBC  WITH DIFFERENTIAL/PLATELET - Abnormal; Notable for the following:    Neutrophils Relative % 81 (*)    Lymphocytes Relative 10 (*)    All other components within normal limits  BASIC METABOLIC PANEL - Abnormal; Notable for the following:    Glucose, Bld 147 (*)    All other components within normal limits  PROTIME-INR - Abnormal; Notable for the following:    Prothrombin Time 30.6 (*)    INR 2.91 (*)    All other components within normal limits    Imaging Review Ct Head Wo Contrast  02/26/2014   CLINICAL DATA:  Loss of vision in the right eye.  EXAM: CT HEAD WITHOUT CONTRAST  TECHNIQUE: Contiguous axial images were obtained from the base of the skull through the vertex without intravenous contrast.  COMPARISON:  None.  FINDINGS: Crescentic hyperdensity in the region of the right retina could  represent hemorrhage. No acute intracranial hemorrhage, infarct, or mass lesion is identified. Cavum vergae incidentally noted. No midline shift. Moderate pansinusitis. No osseous destruction. No skull fracture. Retro-orbital fat is clean.  IMPRESSION: No acute intracranial finding.  Crescentic hyperdensity in the region of the right retina which could represent hemorrhage.   Electronically Signed   By: Conchita Paris M.D.   On: 02/26/2014 18:58     EKG Interpretation None      MDM   Final diagnoses:  Retinal detachment, right  Retinal hemorrhage, right   Patient with concave medical history and on Coumadin for cardiac valve presents with loss of vision his right eye gradually worsening since Friday night. Patient is in a difficult situation as he is on Coumadin for his valve in his heart however his had gradually worsening bleeding and to now his right eye. I had lengthy and multiple discussions with his ophthalmologist Dr. Zigmund Daniel, his hematologis dr Jodelle Green and cardiologist Dr Sondra Come.  The ophthalmologist has been following the patient closely however has been limited due to the patient being on Coumadin. Recently there've been multiple discussions regarding decreasing Coumadin and patient stopped aspirin to see if this would help. Patient's INR today is 2.9. I discussed the case with cardiologist and we discussed risks and benefits with significant vision loss he did agree with and recommended 5 mg IV vitamin K in very close follow-up with ophthalmology and himself outpatient. I spoke with ophthalmologist who did not recommend any surgery at this time especially with already having hemorrhage and a difficult area however he would follow the patient very closely outpatient and see the patient more. I updated the patient and family multiple times. I updated the ophthalmologist as well. Patient unfortunately is no very difficult situation is going blind without any easy decision/treatment  available. The hope is that the bleeding will slowly improve with the vitamin K and ophthalmology will assess tomorrow morning.   Delay in receiving vitamin K, discharge held at this time, patient developed headache prior to starting vitamin K left-sided pressure sensation. CT scan ordered to look for bleeding as patient is on Coumadin. CT scan no acute intracranial bleeding. Discussed close outpatient follow up tomorrow. Results and differential diagnosis were discussed with the patient/parent/guardian. Close follow up outpatient was discussed, comfortable with the plan.   Medications  tetracaine (PONTOCAINE) 0.5 % ophthalmic solution 2 drop (not administered)  phytonadione (VITAMIN K) 5 mg in dextrose 5 % 50 mL IVPB (5 mg Intravenous New Bag/Given 02/26/14 1750)    Filed Vitals:   02/26/14 1500 02/26/14 1600 02/26/14 1654 02/26/14 1700  BP:  133/58 152/36 106/58 159/68  Pulse: 66 71 70 72  Temp:      TempSrc:      Resp:   16   SpO2: 91% 94% 90% 92%    Final diagnoses:  Retinal detachment, right  Retinal hemorrhage, right      Mariea Clonts, MD 02/26/14 1716  Mariea Clonts, MD 02/26/14 [phone]

## 2014-02-26 NOTE — ED Notes (Signed)
Pt reports left side headache, pressure.  Dr Reather Converse at bedside, ct ordered.

## 2014-02-27 ENCOUNTER — Inpatient Hospital Stay (INDEPENDENT_AMBULATORY_CARE_PROVIDER_SITE_OTHER): Payer: Medicare Other | Admitting: Ophthalmology

## 2014-02-27 DIAGNOSIS — H4313 Vitreous hemorrhage, bilateral: Secondary | ICD-10-CM

## 2014-02-27 DIAGNOSIS — H31411 Hemorrhagic choroidal detachment, right eye: Secondary | ICD-10-CM | POA: Diagnosis not present

## 2014-02-27 DIAGNOSIS — H2511 Age-related nuclear cataract, right eye: Secondary | ICD-10-CM

## 2014-02-27 DIAGNOSIS — E11311 Type 2 diabetes mellitus with unspecified diabetic retinopathy with macular edema: Secondary | ICD-10-CM

## 2014-02-27 DIAGNOSIS — H43813 Vitreous degeneration, bilateral: Secondary | ICD-10-CM

## 2014-02-27 DIAGNOSIS — H35033 Hypertensive retinopathy, bilateral: Secondary | ICD-10-CM | POA: Diagnosis not present

## 2014-02-28 ENCOUNTER — Ambulatory Visit (INDEPENDENT_AMBULATORY_CARE_PROVIDER_SITE_OTHER): Payer: Medicare Other | Admitting: Ophthalmology

## 2014-03-27 ENCOUNTER — Encounter (INDEPENDENT_AMBULATORY_CARE_PROVIDER_SITE_OTHER): Payer: Medicare Other | Admitting: Ophthalmology

## 2014-11-29 ENCOUNTER — Other Ambulatory Visit: Payer: Self-pay | Admitting: *Deleted

## 2014-11-29 DIAGNOSIS — I739 Peripheral vascular disease, unspecified: Secondary | ICD-10-CM

## 2014-12-22 ENCOUNTER — Encounter: Payer: Self-pay | Admitting: Vascular Surgery

## 2014-12-29 ENCOUNTER — Ambulatory Visit (INDEPENDENT_AMBULATORY_CARE_PROVIDER_SITE_OTHER)
Admission: RE | Admit: 2014-12-29 | Discharge: 2014-12-29 | Disposition: A | Discharge status: Home / Self Care | Payer: Medicare Other | Source: Ambulatory Visit | Attending: Vascular Surgery | Admitting: Vascular Surgery

## 2014-12-29 ENCOUNTER — Encounter: Payer: Self-pay | Admitting: Vascular Surgery

## 2014-12-29 ENCOUNTER — Ambulatory Visit (INDEPENDENT_AMBULATORY_CARE_PROVIDER_SITE_OTHER): Payer: Medicare Other | Admitting: Vascular Surgery

## 2014-12-29 ENCOUNTER — Ambulatory Visit (HOSPITAL_COMMUNITY)
Admission: RE | Admit: 2014-12-29 | Discharge: 2014-12-29 | Disposition: A | Discharge status: Home / Self Care | Payer: Medicare Other | Source: Ambulatory Visit | Attending: Vascular Surgery | Admitting: Vascular Surgery

## 2014-12-29 VITALS — BP 136/71 | HR 70 | Temp 98.2°F | Resp 16 | Ht 66.0 in | Wt 169.0 lb

## 2014-12-29 DIAGNOSIS — I739 Peripheral vascular disease, unspecified: Secondary | ICD-10-CM

## 2014-12-29 DIAGNOSIS — I872 Venous insufficiency (chronic) (peripheral): Secondary | ICD-10-CM | POA: Insufficient documentation

## 2014-12-29 DIAGNOSIS — I10 Essential (primary) hypertension: Secondary | ICD-10-CM | POA: Diagnosis not present

## 2014-12-29 DIAGNOSIS — E119 Type 2 diabetes mellitus without complications: Secondary | ICD-10-CM | POA: Insufficient documentation

## 2014-12-29 NOTE — Progress Notes (Signed)
HISTORY AND PHYSICAL     CC:  Poor circulation in feet Referring Provider:  Sudie Grumbling, DO  HPI: This is a 64 y.o. male who sees Dr. Caprice Beaver DPM in [place], [place] for foot care due to his diabetes.  His wife has been concerned with the pt's feet turning red when his feet are downward and improving when raising his feet.  He did have abnormal ABI's and was referred to [facility] for further evaluation.  The pt denies any pain with walking or rest.  He does not have any non healing wounds.   He does not get around as much as he would like due to arthritis.  He does have a hx of left hip replacement in the past.   He does have a hx of CABG in 2012 and is on coumadin for a mechanical AVR.  He did have a bleed in his eyes causing him to be blind in his left eye.  He does continue to smoke about a pack/day.  He has smoked for about 48-50 years.  He does not have any interest in quitting.  He drinks beer occasionally on the weekends. He is on a statin for cholesterol management.  He is on an ACEI and beta blocker for blood pressure management.  He does take Spiriva for his COPD.   He takes Amaryl for his diabetes.   Past Medical History  Diagnosis Date  . Hypertension   . Diabetes mellitus without complication ([place])   . Hypercholesteremia   . COPD (chronic obstructive pulmonary disease) ([place])   . Nerve damage     right hand  . Coronary artery disease   . Myocardial infarction ([place])   . Heart murmur   . Hypothyroidism   . Arthritis     Past Surgical History  Procedure Laterality Date  . Appendectomy    . Carotid endarterectomy      left carotid artery  . Cardiac catheterization    . Cardiac valve replacement      aortic  . Cataract extraction      left eye  . Tonsillectomy    . Hernia repair    . Eye surgery    . Coronary artery bypass graft    . Joint replacement      No Known Allergies  Current Outpatient Prescriptions  Medication Sig Dispense Refill  .  amoxicillin-clavulanate (AUGMENTIN) 500-125 MG tablet 3 (three) times daily.    Marland Kitchen BREO ELLIPTA 100-25 MCG/INH AEPB Take 1 puff by mouth daily.     . Cyanocobalamin (RA VITAMIN B-12 TR) 1000 MCG TBCR Take 1 tablet by mouth daily.     Marland Kitchen glimepiride (AMARYL) 1 MG tablet Take 1 mg by mouth daily with breakfast.     . ibuprofen (ADVIL,MOTRIN) 200 MG tablet Take 400 mg by mouth daily as needed for mild pain.    Marland Kitchen KOMBIGLYZE XR 2.05-998 MG TB24 Take 1 tablet by mouth 2 (two) times daily.     Marland Kitchen levothyroxine (SYNTHROID, LEVOTHROID) 25 MCG tablet Take 12.5 mcg by mouth daily before breakfast.     . lisinopril (PRINIVIL,ZESTRIL) 10 MG tablet Take 10 mg by mouth daily.     Marland Kitchen LYRICA 75 MG capsule Take 75 mg by mouth 2 (two) times daily.     . metoprolol (LOPRESSOR) 50 MG tablet Take 50 mg by mouth 2 (two) times daily.     . rosuvastatin (CRESTOR) 20 MG tablet Take 20 mg by mouth every evening.     Marland Kitchen  SPIRIVA HANDIHALER 18 MCG inhalation capsule Place 18 mcg into inhaler and inhale daily.     . Vitamin D, Ergocalciferol, (DRISDOL) 50000 UNITS CAPS capsule Take 50,000 Units by mouth every 7 (seven) days. mondays    . warfarin (COUMADIN) 5 MG tablet Take 2.5-5 mg by mouth daily. Take half of a tablet (2.5mg ) on Mondays and Fridays, and a whole tablet (5mg ) on all other days.    . prednisoLONE acetate (PRED FORTE) 1 % ophthalmic suspension Place 1 drop into the left eye daily.     No current facility-administered medications for this visit.   Facility-Administered Medications Ordered in Other Visits  Medication Dose Route Frequency Provider Last Rate Last Dose  . ceFAZolin (ANCEF) IVPB 2 g/50 mL premix  2 g Intravenous 30 min Pre-Op [name], MD        Family History  Problem Relation Age of Onset  . Stroke Mother   . Cancer Father   . Cancer Other   . Hypertension Other   . Diabetes Other     Social History   Social History  . Marital Status: Married    Spouse Name: N/A  . Number of  Children: N/A  . Years of Education: N/A   Occupational History  . Not on file.   Social History Main Topics  . Smoking status: Current Every Day Smoker -- 1.00 packs/day    Types: Cigarettes  . Smokeless tobacco: Never Used  . Alcohol Use: Yes     Comment: "beer occassionally"  . Drug Use: No  . Sexual Activity: Not on file   Other Topics Concern  . Not on file   Social History Narrative     ROS: [x]  Positive   [ ]  Negative   [ ]  All sytems reviewed and are negative  Cardiovascular: []  chest pain/pressure []  palpitations [x]  hx of CABG and aortic valve replacement (mechanical) []  SOB lying flat []  DOE []  pain in legs while walking []  pain in feet when lying flat []  hx of DVT []  hx of phlebitis []  swelling in legs []  varicose veins  Pulmonary: []  productive cough []  asthma [x]  COPD/emphysema  []  wheezing  Neurologic: []  weakness in []  arms []  legs []  numbness in []  arms []  legs [] difficulty speaking or slurred speech []  temporary loss of vision in one eye []  dizziness  Hematologic: []  bleeding problems []  problems with blood clotting easily  GI []  vomiting blood []  blood in stool  GU: []  burning with urination []  blood in urine  Psychiatric: []  hx of major depression  Integumentary: []  rashes []  ulcers  Constitutional: []  fever []  chills   PHYSICAL EXAMINATION:  Filed Vitals:   12/29/14 1430 12/29/14 1441  BP: 146/69 136/71  Pulse: 70 70  Temp: 98.2 F (36.8 C)   Resp: 16    Body mass index is 27.29 kg/(m^2).  General:  WDWN in NAD Gait: Not observed HENT: WNL, normocephalic Pulmonary: normal non-labored breathing , without Rales, rhonchi,  wheezing Cardiac: RRR, without  Murmurs, rubs or gallops; without carotid bruits Abdomen: soft, NT, no masses; well healed midline scar from hernia repair.  Skin: without rashes; well healed sternotomy scar Vascular Exam/Pulses:  Right Left  Radial absent absent  Ulnar absent absent    Brachial 2+ (normal) 2+ (normal)  Femoral 1+ (weak) Unable to palpate   Popliteal Unable to palpate  Unable to palpate   DP Unable to palpate  Unable to palpate   PT Unable to palpate  Unable to palpate    Extremities: without ischemic changes, without Gangrene , without cellulitis; without open wounds; +dependent rubor Musculoskeletal: no muscle wasting or atrophy  Neurologic: A&O X 3; Appropriate Affect ; SENSATION: normal; MOTOR FUNCTION:  moving all extremities equally. Speech is fluent/normal Psychiatric: Judgment intact, Mood & affect appropriate for pt's clinical situation Lymph : No Cervical, Axillary, or Inguinal lymphadenopathy    Non-Invasive Vascular Imaging:   ABI's 12/29/14: Non compressible vessels  Right triphasic femoral Right biphasic PT Right biphasic AT  Left triphasic femoral Left biphasic PT Left biphasic AT  TBI's 12/29/14: Right:  0.29 Left:  0.27  Pt meds includes: Statin:  Yes.   Beta Blocker:  Yes.   Aspirin:  No. ACEI:  Yes.   ARB:  No. Other Antiplatelet/Anticoagulant:  Yes.   Coumadin   ASSESSMENT/PLAN:: 64 y.o. male with PAD and most likely some venous insufficiency  -pt does have some component of PAD with Black vessels and decreased TBI's.  The pt is not having any rest pain, claudication or non healing wounds.   Would not proceed with any diagnostic angiogram at this time.  -encouraged pt to continue walking regimen, continue meticulous foot care and continue seeing Dr. Caprice Beaver. -the pt and his wife do not want to travel to [facility] for routine ABI's in 6 months.  Recommend ABI's with Dr. Caprice Beaver and if there is any profound changes or pt develops rest pain or non healing wounds, then return to [facility] for follow up.   -d/w pt and his wife importance of smoking cessation, however, he has no interest in quitting.   -continue statin    Leontine Locket, PA-C [facility] [phone]  Clinic MD:  Pt seen and  examined in conjunction with Dr. Bridgett Larsson  Addendum  I have independently interviewed and examined the patient, and I agree with the physician assistant's findings.  Patient has obvious medial calcification on his ABI.  His waveforms are suggestive of biphasic flow with hyperemia.  Additionally, his chronic venous insufficiency as demonstrated by resolution of his foot discoloration with positional change is likely contributing to the slightly abnormal waveforms.  This patient is essentially ASYMPTOMATIC as his osteoarthritis limits his ambulation.  I would subsequently managed him first medically, adding the walking plan.  He should follow up in 6 months to recheck the ABI.  I reiterated the importance of smoking cessation but patient declined any intervention.  [name], MD [facility] Office: [phone] Pager: [phone]  12/29/2014, 5:57 PM

## 2014-12-29 NOTE — Progress Notes (Signed)
Filed Vitals:   12/29/14 1430 12/29/14 1441  BP: 146/69 136/71  Pulse: 70 70  Temp: 98.2 F (36.8 C)   TempSrc: Oral   Resp: 16   Height: 5\' 6"  (1.676 m)   Weight: 169 lb (76.658 kg)   SpO2: 97%

## 2015-02-08 ENCOUNTER — Telehealth: Payer: Self-pay | Admitting: Vascular Surgery

## 2015-02-08 NOTE — Telephone Encounter (Signed)
-----   Message from Denman George, RN sent at 02/08/2015 10:05 AM EST ----- Regarding: appt. with [facility] 02/09/15 Dr. Caprice Beaver requests to see pt. for wound and black toe (laterality not given); Suanne Marker will be faxing the office note.  Please add on to Dr. Lianne Moris schedule 02/09/15 @ 10:15 AM.  Appt. given to pt. via Dr. Arvil Persons office. ([facility] [phone])

## 2015-02-09 ENCOUNTER — Encounter: Payer: Self-pay | Admitting: Vascular Surgery

## 2015-02-09 ENCOUNTER — Other Ambulatory Visit: Payer: Self-pay

## 2015-02-09 ENCOUNTER — Ambulatory Visit (INDEPENDENT_AMBULATORY_CARE_PROVIDER_SITE_OTHER): Payer: Medicare Other | Admitting: Vascular Surgery

## 2015-02-09 VITALS — BP 141/81 | HR 67 | Temp 97.7°F | Resp 16 | Ht 66.0 in | Wt 175.0 lb

## 2015-02-09 DIAGNOSIS — I872 Venous insufficiency (chronic) (peripheral): Secondary | ICD-10-CM | POA: Diagnosis not present

## 2015-02-09 DIAGNOSIS — I7025 Atherosclerosis of native arteries of other extremities with ulceration: Secondary | ICD-10-CM | POA: Diagnosis not present

## 2015-02-09 NOTE — Progress Notes (Signed)
Established Intermittent Claudication  History of Present Illness  Christian Gibson is a 65 y.o. (Jun 22, 1950) male who presents with chief complaint: R foot ulcer.  This patient has a combination of chronic venous insufficiency and mild PAD based on prior studies.  The patient was seen by his Podiatry recently and found to have an laceration in his R lateral foot which was black.  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  The patient's symptoms have not progressed.  The patient's symptoms are: essentially asx without intermittent claudication due to limited ambulation and no rest pain.  The patient's treatment regimen currently included: maximal medical management and walking plan.  Past Medical History  Diagnosis Date  . Hypertension   . Diabetes mellitus without complication ([place])   . Hypercholesteremia   . COPD (chronic obstructive pulmonary disease) ([place])   . Nerve damage     right hand  . Coronary artery disease   . Myocardial infarction ([place])   . Heart murmur   . Hypothyroidism   . Arthritis     Past Surgical History  Procedure Laterality Date  . Appendectomy    . Carotid endarterectomy      left carotid artery  . Cardiac catheterization    . Cardiac valve replacement      aortic  . Cataract extraction      left eye  . Tonsillectomy    . Hernia repair    . Eye surgery    . Coronary artery bypass graft    . Joint replacement      Social History   Social History  . Marital Status: Married    Spouse Name: N/A  . Number of Children: N/A  . Years of Education: N/A   Occupational History  . Not on file.   Social History Main Topics  . Smoking status: Current Every Day Smoker -- 1.00 packs/day    Types: Cigarettes  . Smokeless tobacco: Never Used  . Alcohol Use: Yes     Comment: "beer occassionally"  . Drug Use: No  . Sexual Activity: Not on file   Other Topics Concern  . Not on file   Social History  Narrative    Family History  Problem Relation Age of Onset  . Stroke Mother   . Cancer Father   . Cancer Other   . Hypertension Other   . Diabetes Other     Current Outpatient Prescriptions  Medication Sig Dispense Refill  . acetaminophen (TYLENOL) 325 MG tablet Take 650 mg by mouth as needed.    Marland Kitchen BREO ELLIPTA 100-25 MCG/INH AEPB Take 1 puff by mouth daily.     . cephALEXin (KEFLEX) 500 MG capsule 500 mg 3 (three) times daily.     . Cyanocobalamin (RA VITAMIN B-12 TR) 1000 MCG TBCR Take 1 tablet by mouth daily.     Marland Kitchen glimepiride (AMARYL) 1 MG tablet Take 1 mg by mouth daily with breakfast.     . KOMBIGLYZE XR 2.05-998 MG TB24 Take 1 tablet by mouth 2 (two) times daily.     Marland Kitchen levothyroxine (SYNTHROID, LEVOTHROID) 25 MCG tablet Take 12.5 mcg by mouth daily before breakfast.     . lisinopril (PRINIVIL,ZESTRIL) 10 MG tablet Take 10 mg by mouth daily.     Marland Kitchen LYRICA 75 MG capsule Take 75 mg by mouth 2 (two) times daily.     . metoprolol (LOPRESSOR) 50 MG tablet Take 50 mg by  mouth 2 (two) times daily.     . rosuvastatin (CRESTOR) 20 MG tablet Take 20 mg by mouth every evening.     Marland Kitchen SPIRIVA HANDIHALER 18 MCG inhalation capsule Place 18 mcg into inhaler and inhale daily.     . Vitamin D, Ergocalciferol, (DRISDOL) 50000 UNITS CAPS capsule Take 50,000 Units by mouth every 7 (seven) days. mondays    . warfarin (COUMADIN) 5 MG tablet Take 2.5-5 mg by mouth daily. Take half of a tablet (2.5mg ) on Mondays and Fridays, and a whole tablet (5mg ) on all other days.    Marland Kitchen amoxicillin-clavulanate (AUGMENTIN) 500-125 MG tablet 3 (three) times daily. Reported on 02/09/2015    . ibuprofen (ADVIL,MOTRIN) 200 MG tablet Take 400 mg by mouth daily as needed for mild pain. Reported on 02/09/2015    . prednisoLONE acetate (PRED FORTE) 1 % ophthalmic suspension Place 1 drop into the left eye daily. Reported on 02/09/2015     No current facility-administered medications for this visit.   Facility-Administered  Medications Ordered in Other Visits  Medication Dose Route Frequency Provider Last Rate Last Dose  . ceFAZolin (ANCEF) IVPB 2 g/50 mL premix  2 g Intravenous 30 min Pre-Op [name], MD         No Known Allergies   REVIEW OF SYSTEMS:  (Positives checked otherwise negative)  CARDIOVASCULAR:   [ ]  chest pain,  [ ]  chest pressure,  [ ]  palpitations,  [ ]  shortness of breath when laying flat,  [ ]  shortness of breath with exertion,   [ ]  pain in feet when walking,  [ ]  pain in feet when laying flat, [ ]  history of blood clot in veins (DVT),  [ ]  history of phlebitis,  [ ]  swelling in legs,  [ ]  varicose veins  PULMONARY:   [ ]  productive cough,  [ ]  asthma,  [ ]  wheezing  NEUROLOGIC:   [ ]  weakness in arms or legs,  [ ]  numbness in arms or legs,  [ ]  difficulty speaking or slurred speech,  [ ]  temporary loss of vision in one eye,  [ ]  dizziness  HEMATOLOGIC:   [ ]  bleeding problems,  [ ]  problems with blood clotting too easily  MUSCULOSKEL:   [ ]  joint pain, [ ]  joint swelling  GASTROINTEST:   [ ]  vomiting blood,  [ ]  blood in stool     GENITOURINARY:   [ ]  burning with urination,  [ ]  blood in urine  PSYCHIATRIC:   [ ]  history of major depression  INTEGUMENTARY:   [ ]  rashes,  [x]  ulcers  CONSTITUTIONAL:   [ ]  fever,  [ ]  chills    Physical Examination  Filed Vitals:   02/09/15 1014 02/09/15 1025  BP: 149/84 141/81  Pulse: 68 67  Temp: 97.7 F (36.5 C)   TempSrc: Oral   Resp: 16   Height: 5\' 6"  (1.676 m)   Weight: 175 lb (79.379 kg)   SpO2: 97%    Body mass index is 28.26 kg/(m^2).  General: A&O x 3, WDWN  Pulmonary: Sym exp, good air movt, CTAB, no rales, rhonchi, & wheezing  Cardiac: RRR, Nl S1, S2, no Murmurs, rubs or gallops  Vascular: Vessel Right Left  Radial Palpable Palpable  Brachial Palpable Palpable  Carotid Palpable, without bruit Palpable, without bruit  Aorta Not palpable N/A  Femoral Palpable Palpable   Popliteal Not palpable Not palpable  PT Not Palpable Not Palpable  DP Not Palpable Not Palpable  Gastrointestinal: soft, NTND, no G/R, no HSM, no masses, no CVAT B  Musculoskeletal: M/S 5/5 throughout , Extremities without ischemic changes except laceration in skin overlying R lateral 5th MT with black eschar base  Neurologic: Pain and light touch intact in extremities , Motor exam as listed above   Medical Decision Making  Jordi Mcgillen is a 65 y.o. 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  So patient at risk for inability to heal his R foot  Based on the patient's vascular studies and examination, I have offered the patient: Aortogram, bilateral leg runoff, possible R leg runoff, possible RLE OA+PTA. I discussed with the patient the nature of angiographic procedures, especially the limited patencies of any endovascular intervention.  The patient is aware of that the risks of an angiographic procedure include but are not limited to: bleeding, infection, access site complications, renal failure, embolization, rupture of vessel, dissection, possible need for emergent surgical intervention, possible need for surgical procedures to treat the patient's pathology, anaphylactic reaction to contrast, and stroke and death.   The patient is aware of the risks and agrees to proceed.  I discussed in depth with the patient the nature of atherosclerosis, and emphasized the importance of maximal medical management including strict control of blood pressure, blood glucose, and lipid levels, antiplatelet agents, obtaining regular exercise, and cessation of smoking.    The patient is aware that without maximal medical management the underlying atherosclerotic disease process will progress, limiting the benefit of any interventions. The patient is currently on a statin: Crestor. The patient is currently not on an anti-platelet: as he is on anticoagulation.  Thank  you for allowing [place] to participate in this patient's care.   [name], MD [facility] Office: [phone] Pager: [phone]  02/09/2015, 4:51 PM

## 2015-02-13 ENCOUNTER — Telehealth: Payer: Self-pay | Admitting: *Deleted

## 2015-02-13 NOTE — Telephone Encounter (Signed)
Attempted to contact patient to let him know that I had gotten approval from Dr. Sondra Come [facility] [phone]) from him to stop his Coumadin after taking dose on 02-09-15. No Bridge needed and patient is to start taking Coumadin the evening after procedure. Patient is to call Dr. Everitt Amber office to get blood work 3 days after procedure.  I will keep trying to call patient.

## 2015-02-15 ENCOUNTER — Other Ambulatory Visit: Payer: Self-pay

## 2015-02-15 ENCOUNTER — Ambulatory Visit (HOSPITAL_COMMUNITY)
Admission: RE | Admit: 2015-02-15 | Discharge: 2015-02-15 | Disposition: A | Discharge status: Home / Self Care | Payer: Medicare Other | Source: Ambulatory Visit | Attending: Vascular Surgery | Admitting: Vascular Surgery

## 2015-02-15 ENCOUNTER — Encounter (HOSPITAL_COMMUNITY): Payer: Self-pay | Admitting: Vascular Surgery

## 2015-02-15 ENCOUNTER — Encounter (HOSPITAL_COMMUNITY)
Admission: RE | Disposition: A | Discharge status: Home / Self Care | Payer: Self-pay | Source: Ambulatory Visit | Attending: Vascular Surgery

## 2015-02-15 DIAGNOSIS — R011 Cardiac murmur, unspecified: Secondary | ICD-10-CM | POA: Diagnosis not present

## 2015-02-15 DIAGNOSIS — E11621 Type 2 diabetes mellitus with foot ulcer: Secondary | ICD-10-CM | POA: Diagnosis not present

## 2015-02-15 DIAGNOSIS — I10 Essential (primary) hypertension: Secondary | ICD-10-CM | POA: Diagnosis not present

## 2015-02-15 DIAGNOSIS — E1151 Type 2 diabetes mellitus with diabetic peripheral angiopathy without gangrene: Secondary | ICD-10-CM | POA: Diagnosis not present

## 2015-02-15 DIAGNOSIS — I70235 Atherosclerosis of native arteries of right leg with ulceration of other part of foot: Secondary | ICD-10-CM | POA: Diagnosis not present

## 2015-02-15 DIAGNOSIS — M1990 Unspecified osteoarthritis, unspecified site: Secondary | ICD-10-CM | POA: Diagnosis not present

## 2015-02-15 DIAGNOSIS — Z952 Presence of prosthetic heart valve: Secondary | ICD-10-CM | POA: Insufficient documentation

## 2015-02-15 DIAGNOSIS — E1142 Type 2 diabetes mellitus with diabetic polyneuropathy: Secondary | ICD-10-CM | POA: Diagnosis not present

## 2015-02-15 DIAGNOSIS — E7800 Pure hypercholesterolemia, unspecified: Secondary | ICD-10-CM | POA: Diagnosis not present

## 2015-02-15 DIAGNOSIS — I2510 Atherosclerotic heart disease of native coronary artery without angina pectoris: Secondary | ICD-10-CM | POA: Diagnosis not present

## 2015-02-15 DIAGNOSIS — E039 Hypothyroidism, unspecified: Secondary | ICD-10-CM | POA: Insufficient documentation

## 2015-02-15 DIAGNOSIS — I7025 Atherosclerosis of native arteries of other extremities with ulceration: Secondary | ICD-10-CM | POA: Diagnosis present

## 2015-02-15 DIAGNOSIS — J449 Chronic obstructive pulmonary disease, unspecified: Secondary | ICD-10-CM | POA: Diagnosis not present

## 2015-02-15 DIAGNOSIS — Z951 Presence of aortocoronary bypass graft: Secondary | ICD-10-CM | POA: Diagnosis not present

## 2015-02-15 DIAGNOSIS — F17210 Nicotine dependence, cigarettes, uncomplicated: Secondary | ICD-10-CM | POA: Diagnosis not present

## 2015-02-15 DIAGNOSIS — L97519 Non-pressure chronic ulcer of other part of right foot with unspecified severity: Secondary | ICD-10-CM | POA: Diagnosis not present

## 2015-02-15 DIAGNOSIS — I252 Old myocardial infarction: Secondary | ICD-10-CM | POA: Insufficient documentation

## 2015-02-15 DIAGNOSIS — Z7901 Long term (current) use of anticoagulants: Secondary | ICD-10-CM | POA: Diagnosis not present

## 2015-02-15 HISTORY — PX: PERIPHERAL VASCULAR CATHETERIZATION: SHX172C

## 2015-02-15 LAB — POCT I-STAT, CHEM 8
BUN: 14 mg/dL (ref 6–20)
CALCIUM ION: 1.1 mmol/L — AB (ref 1.13–1.30)
CREATININE: 0.9 mg/dL (ref 0.61–1.24)
Chloride: 102 mmol/L (ref 101–111)
GLUCOSE: 110 mg/dL — AB (ref 65–99)
HEMATOCRIT: 45 % (ref 39.0–52.0)
HEMOGLOBIN: 15.3 g/dL (ref 13.0–17.0)
Potassium: 4 mmol/L (ref 3.5–5.1)
Sodium: 140 mmol/L (ref 135–145)
TCO2: 25 mmol/L (ref 0–100)

## 2015-02-15 LAB — PROTIME-INR
INR: 1.14 (ref 0.00–1.49)
PROTHROMBIN TIME: 14.8 s (ref 11.6–15.2)

## 2015-02-15 SURGERY — ABDOMINAL AORTOGRAM
Anesthesia: LOCAL

## 2015-02-15 MED ORDER — FENTANYL CITRATE (PF) 100 MCG/2ML IJ SOLN
INTRAMUSCULAR | Status: AC
  Filled 2015-02-15: qty 2

## 2015-02-15 MED ORDER — HEPARIN (PORCINE) IN NACL 2-0.9 UNIT/ML-% IJ SOLN
INTRAMUSCULAR | Status: AC
  Filled 2015-02-15: qty 1000

## 2015-02-15 MED ORDER — HYDRALAZINE HCL 20 MG/ML IJ SOLN
INTRAMUSCULAR | Status: AC
  Filled 2015-02-15: qty 1

## 2015-02-15 MED ORDER — HYDRALAZINE HCL 20 MG/ML IJ SOLN
INTRAMUSCULAR | Status: DC | PRN
  Administered 2015-02-15: 10 mg via INTRAVENOUS

## 2015-02-15 MED ORDER — LIDOCAINE HCL (PF) 1 % IJ SOLN
INTRAMUSCULAR | Status: AC
  Filled 2015-02-15: qty 30

## 2015-02-15 MED ORDER — HYDRALAZINE HCL 20 MG/ML IJ SOLN: 10.0000 mg | INTRAMUSCULAR | Status: DC | PRN

## 2015-02-15 MED ORDER — MIDAZOLAM HCL 2 MG/2ML IJ SOLN
INTRAMUSCULAR | Status: DC | PRN
  Administered 2015-02-15 (×2): 0.5 mg via INTRAVENOUS

## 2015-02-15 MED ORDER — IODIXANOL 320 MG/ML IV SOLN
INTRAVENOUS | Status: DC | PRN
  Administered 2015-02-15: 115 mL via INTRA_ARTERIAL

## 2015-02-15 MED ORDER — SODIUM CHLORIDE 0.9 % IV SOLN
INTRAVENOUS | Status: DC
  Administered 2015-02-15: 07:00:00 via INTRAVENOUS

## 2015-02-15 MED ORDER — OXYCODONE-ACETAMINOPHEN 5-325 MG PO TABS: 1.0000 | ORAL_TABLET | Freq: Four times a day (QID) | ORAL | Status: DC | PRN

## 2015-02-15 MED ORDER — MORPHINE SULFATE (PF) 2 MG/ML IV SOLN
2.0000 mg | INTRAVENOUS | Status: DC | PRN
Start: 2015-02-15 — End: 2015-02-15

## 2015-02-15 MED ORDER — LIDOCAINE HCL (PF) 1 % IJ SOLN
INTRAMUSCULAR | Status: DC | PRN
  Administered 2015-02-15: 08:00:00

## 2015-02-15 MED ORDER — MIDAZOLAM HCL 2 MG/2ML IJ SOLN
INTRAMUSCULAR | Status: AC
  Filled 2015-02-15: qty 2

## 2015-02-15 MED ORDER — SODIUM CHLORIDE 0.9 % IV SOLN: 1.0000 mL/kg/h | INTRAVENOUS | Status: DC

## 2015-02-15 MED ORDER — FENTANYL CITRATE (PF) 100 MCG/2ML IJ SOLN
INTRAMUSCULAR | Status: DC | PRN
  Administered 2015-02-15 (×2): 25 ug via INTRAVENOUS

## 2015-02-15 SURGICAL SUPPLY — 11 items
CATH OMNI FLUSH 5F 65CM (CATHETERS) ×2 IMPLANT
CATH STRAIGHT 5FR 65CM (CATHETERS) ×2 IMPLANT
COVER PRB 48X5XTLSCP FOLD TPE (BAG) ×1 IMPLANT
COVER PROBE 5X48 (BAG) ×1
KIT MICROINTRODUCER STIFF 5F (SHEATH) ×2 IMPLANT
KIT PV (KITS) ×2 IMPLANT
SHEATH PINNACLE 5F 10CM (SHEATH) ×2 IMPLANT
SYR MEDRAD MARK V 150ML (SYRINGE) ×2 IMPLANT
TRANSDUCER W/STOPCOCK (MISCELLANEOUS) ×2 IMPLANT
TRAY PV CATH (CUSTOM PROCEDURE TRAY) ×2 IMPLANT
WIRE BENTSON .035X145CM (WIRE) ×2 IMPLANT

## 2015-02-15 NOTE — Discharge Instructions (Signed)
Angiogram, Care After °Refer to this sheet in the next few weeks. These instructions provide you with information about caring for yourself after your procedure. Your health care provider may also give you more specific instructions. Your treatment has been planned according to current medical practices, but problems sometimes occur. Call your health care provider if you have any problems or questions after your procedure. °WHAT TO EXPECT AFTER THE PROCEDURE °After your procedure, it is typical to have the following: °· Bruising at the catheter insertion site that usually fades within 1-2 weeks. °· Blood collecting in the tissue (hematoma) that may be painful to the touch. It should usually decrease in size and tenderness within 1-2 weeks. °HOME CARE INSTRUCTIONS °· Take medicines only as directed by your health care provider. °· You may shower 24-48 hours after the procedure or as directed by your health care provider. Remove the bandage (dressing) and gently wash the site with plain soap and water. Pat the area dry with a clean towel. Do not rub the site, because this may cause bleeding. °· Do not take baths, swim, or use a hot tub until your health care provider approves. °· Check your insertion site every day for redness, swelling, or drainage. °· Do not apply powder or lotion to the site. °· Do not lift over 10 lb (4.5 kg) for 5 days after your procedure or as directed by your health care provider. °· Ask your health care provider when it is okay to: °¨ Return to work or school. °¨ Resume usual physical activities or sports. °¨ Resume sexual activity. °· Do not drive home if you are discharged the same day as the procedure. Have someone else drive you. °· You may drive 24 hours after the procedure unless otherwise instructed by your health care provider. °· Do not operate machinery or power tools for 24 hours after the procedure or as directed by your health care provider. °· If your procedure was done as an  outpatient procedure, which means that you went home the same day as your procedure, a responsible adult should be with you for the first 24 hours after you arrive home. °· Keep all follow-up visits as directed by your health care provider. This is important. °SEEK MEDICAL CARE IF: °· You have a fever. °· You have chills. °· You have increased bleeding from the catheter insertion site. Hold pressure on the site and call 911. °SEEK IMMEDIATE MEDICAL CARE IF: °· You have unusual pain at the catheter insertion site. °· You have redness, warmth, or swelling at the catheter insertion site. °· You have drainage (other than a small amount of blood on the dressing) from the catheter insertion site. °· The catheter insertion site is bleeding, and the bleeding does not stop after 30 minutes of holding steady pressure on the site. °· The area near or just beyond the catheter insertion site becomes pale, cool, tingly, or numb. °  °This information is not intended to replace advice given to you by your health care provider. Make sure you discuss any questions you have with your health care provider. °  °Document Released: 07/25/2004 Document Revised: 01/27/2014 Document Reviewed: 06/09/2012 °Elsevier Interactive Patient Education ©2016 [employer] ° °

## 2015-02-15 NOTE — Interval H&P Note (Signed)
History and Physical Interval Note:  02/15/2015 7:16 AM  Christian Gibson  has presented today for surgery, with the diagnosis of pvd  The various methods of treatment have been discussed with the patient and family. After consideration of risks, benefits and other options for treatment, the patient has consented to  Procedure(s): Abdominal Aortogram (N/A) as a surgical intervention .  The patient's history has been reviewed, patient examined, no change in status, stable for surgery.  I have reviewed the patient's chart and labs.  Questions were answered to the patient's satisfaction.     [name]

## 2015-02-15 NOTE — H&P (View-Only) (Signed)
Established Intermittent Claudication  History of Present Illness  Christian Gibson is a 65 y.o. ([birth year]) male who presents with chief complaint: R foot ulcer.  This patient has a combination of chronic venous insufficiency and mild PAD based on prior studies.  The patient was seen by his Podiatry recently and found to have an laceration in his R lateral foot which was black.  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  The patient's symptoms have not progressed.  The patient's symptoms are: essentially asx without intermittent claudication due to limited ambulation and no rest pain.  The patient's treatment regimen currently included: maximal medical management and walking plan.  Past Medical History  Diagnosis Date  . Hypertension   . Diabetes mellitus without complication ([place])   . Hypercholesteremia   . COPD (chronic obstructive pulmonary disease) ([place])   . Nerve damage     right hand  . Coronary artery disease   . Myocardial infarction ([place])   . Heart murmur   . Hypothyroidism   . Arthritis     Past Surgical History  Procedure Laterality Date  . Appendectomy    . Carotid endarterectomy      left carotid artery  . Cardiac catheterization    . Cardiac valve replacement      aortic  . Cataract extraction      left eye  . Tonsillectomy    . Hernia repair    . Eye surgery    . Coronary artery bypass graft    . Joint replacement      Social History   Social History  . Marital Status: Married    Spouse Name: N/A  . Number of Children: N/A  . Years of Education: N/A   Occupational History  . Not on file.   Social History Main Topics  . Smoking status: Current Every Day Smoker -- 1.00 packs/day    Types: Cigarettes  . Smokeless tobacco: Never Used  . Alcohol Use: Yes     Comment: "beer occassionally"  . Drug Use: No  . Sexual Activity: Not on file   Other Topics Concern  . Not on file   Social History  Narrative    Family History  Problem Relation Age of Onset  . Stroke Mother   . Cancer Father   . Cancer Other   . Hypertension Other   . Diabetes Other     Current Outpatient Prescriptions  Medication Sig Dispense Refill  . acetaminophen (TYLENOL) 325 MG tablet Take 650 mg by mouth as needed.    Marland Kitchen BREO ELLIPTA 100-25 MCG/INH AEPB Take 1 puff by mouth daily.     . cephALEXin (KEFLEX) 500 MG capsule 500 mg 3 (three) times daily.     . Cyanocobalamin (RA VITAMIN B-12 TR) 1000 MCG TBCR Take 1 tablet by mouth daily.     Marland Kitchen glimepiride (AMARYL) 1 MG tablet Take 1 mg by mouth daily with breakfast.     . KOMBIGLYZE XR 2.05-998 MG TB24 Take 1 tablet by mouth 2 (two) times daily.     Marland Kitchen levothyroxine (SYNTHROID, LEVOTHROID) 25 MCG tablet Take 12.5 mcg by mouth daily before breakfast.     . lisinopril (PRINIVIL,ZESTRIL) 10 MG tablet Take 10 mg by mouth daily.     Marland Kitchen LYRICA 75 MG capsule Take 75 mg by mouth 2 (two) times daily.     . metoprolol (LOPRESSOR) 50 MG tablet Take 50 mg by  mouth 2 (two) times daily.     . rosuvastatin (CRESTOR) 20 MG tablet Take 20 mg by mouth every evening.     Marland Kitchen SPIRIVA HANDIHALER 18 MCG inhalation capsule Place 18 mcg into inhaler and inhale daily.     . Vitamin D, Ergocalciferol, (DRISDOL) 50000 UNITS CAPS capsule Take 50,000 Units by mouth every 7 (seven) days. mondays    . warfarin (COUMADIN) 5 MG tablet Take 2.5-5 mg by mouth daily. Take half of a tablet (2.5mg ) on Mondays and Fridays, and a whole tablet (5mg ) on all other days.    Marland Kitchen amoxicillin-clavulanate (AUGMENTIN) 500-125 MG tablet 3 (three) times daily. Reported on 02/09/2015    . ibuprofen (ADVIL,MOTRIN) 200 MG tablet Take 400 mg by mouth daily as needed for mild pain. Reported on 02/09/2015    . prednisoLONE acetate (PRED FORTE) 1 % ophthalmic suspension Place 1 drop into the left eye daily. Reported on 02/09/2015     No current facility-administered medications for this visit.   Facility-Administered  Medications Ordered in Other Visits  Medication Dose Route Frequency Provider Last Rate Last Dose  . ceFAZolin (ANCEF) IVPB 2 g/50 mL premix  2 g Intravenous 30 min Pre-Op [name], MD         No Known Allergies   REVIEW OF SYSTEMS:  (Positives checked otherwise negative)  CARDIOVASCULAR:   [ ]  chest pain,  [ ]  chest pressure,  [ ]  palpitations,  [ ]  shortness of breath when laying flat,  [ ]  shortness of breath with exertion,   [ ]  pain in feet when walking,  [ ]  pain in feet when laying flat, [ ]  history of blood clot in veins (DVT),  [ ]  history of phlebitis,  [ ]  swelling in legs,  [ ]  varicose veins  PULMONARY:   [ ]  productive cough,  [ ]  asthma,  [ ]  wheezing  NEUROLOGIC:   [ ]  weakness in arms or legs,  [ ]  numbness in arms or legs,  [ ]  difficulty speaking or slurred speech,  [ ]  temporary loss of vision in one eye,  [ ]  dizziness  HEMATOLOGIC:   [ ]  bleeding problems,  [ ]  problems with blood clotting too easily  MUSCULOSKEL:   [ ]  joint pain, [ ]  joint swelling  GASTROINTEST:   [ ]  vomiting blood,  [ ]  blood in stool     GENITOURINARY:   [ ]  burning with urination,  [ ]  blood in urine  PSYCHIATRIC:   [ ]  history of major depression  INTEGUMENTARY:   [ ]  rashes,  [x]  ulcers  CONSTITUTIONAL:   [ ]  fever,  [ ]  chills    Physical Examination  Filed Vitals:   02/09/15 1014 02/09/15 1025  BP: 149/84 141/81  Pulse: 68 67  Temp: 97.7 F (36.5 C)   TempSrc: Oral   Resp: 16   Height: 5\' 6"  (1.676 m)   Weight: 175 lb (79.379 kg)   SpO2: 97%    Body mass index is 28.26 kg/(m^2).  General: A&O x 3, WDWN  Pulmonary: Sym exp, good air movt, CTAB, no rales, rhonchi, & wheezing  Cardiac: RRR, Nl S1, S2, no Murmurs, rubs or gallops  Vascular: Vessel Right Left  Radial Palpable Palpable  Brachial Palpable Palpable  Carotid Palpable, without bruit Palpable, without bruit  Aorta Not palpable N/A  Femoral Palpable Palpable    Popliteal Not palpable Not palpable  PT Not Palpable Not Palpable  DP Not Palpable Not  Palpable   Gastrointestinal: soft, NTND, no G/R, no HSM, no masses, no CVAT B  Musculoskeletal: M/S 5/5 throughout , Extremities without ischemic changes except laceration in skin overlying R lateral 5th MT with black eschar base  Neurologic: Pain and light touch intact in extremities , Motor exam as listed above   Medical Decision Making  Christian Gibson is a 65 y.o. 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  So patient at risk for inability to heal his R foot  Based on the patient's vascular studies and examination, I have offered the patient: Aortogram, bilateral leg runoff, possible R leg runoff, possible RLE OA+PTA. I discussed with the patient the nature of angiographic procedures, especially the limited patencies of any endovascular intervention.  The patient is aware of that the risks of an angiographic procedure include but are not limited to: bleeding, infection, access site complications, renal failure, embolization, rupture of vessel, dissection, possible need for emergent surgical intervention, possible need for surgical procedures to treat the patient's pathology, anaphylactic reaction to contrast, and stroke and death.   The patient is aware of the risks and agrees to proceed.  I discussed in depth with the patient the nature of atherosclerosis, and emphasized the importance of maximal medical management including strict control of blood pressure, blood glucose, and lipid levels, antiplatelet agents, obtaining regular exercise, and cessation of smoking.    The patient is aware that without maximal medical management the underlying atherosclerotic disease process will progress, limiting the benefit of any interventions. The patient is currently on a statin: Crestor. The patient is currently not on an anti-platelet: as he is on anticoagulation.  Thank  you for allowing [place] to participate in this patient's care.   [name], MD [facility] [place] Office: [phone] Pager: [phone]  02/09/2015, 4:51 PM

## 2015-02-16 ENCOUNTER — Other Ambulatory Visit: Payer: Self-pay | Admitting: *Deleted

## 2015-02-16 DIAGNOSIS — I739 Peripheral vascular disease, unspecified: Secondary | ICD-10-CM

## 2015-02-16 DIAGNOSIS — Z01810 Encounter for preprocedural cardiovascular examination: Secondary | ICD-10-CM

## 2015-02-19 ENCOUNTER — Telehealth: Payer: Self-pay | Admitting: Vascular Surgery

## 2015-02-19 NOTE — Telephone Encounter (Addendum)
-----   Message from Mena Goes, RN sent at 02/16/2015  9:52 AM EST ----- Regarding: schedule cardio eval FYI for future surgery   ----- Message -----    From: Conrad [place], MD    Sent: 02/16/2015   9:47 AM      To: [facility] [address]  Christian Gibson [id number] Dec 27, 1950  Date: 02/15/15 (Yesterday)  PROCEDURE: 1. left common femoral artery cannulation under ultrasound guidance 2. Placement of catheter in aorta 3. Aortogram 4. Conscious sedation for 60 minutes 5. Second order arterial selection 6. Right leg runoff via catheter 7. Left leg runoff via sheath  F/U: after cardiology appt  Needs: ASAP cardiology evaluation re: preop for R pop-peroneal bypass   notified patient of fu appt. with dr. Bridgett Larsson on 03-02-15 at 10:15, also faxed cardiac clearance form to dr. Sondra Come [phone]

## 2015-02-20 ENCOUNTER — Encounter: Payer: Self-pay | Admitting: Vascular Surgery

## 2015-02-23 ENCOUNTER — Encounter: Payer: Self-pay | Admitting: Vascular Surgery

## 2015-03-02 ENCOUNTER — Encounter: Payer: Self-pay | Admitting: Vascular Surgery

## 2015-03-02 ENCOUNTER — Ambulatory Visit (INDEPENDENT_AMBULATORY_CARE_PROVIDER_SITE_OTHER): Payer: Medicare Other | Admitting: Vascular Surgery

## 2015-03-02 VITALS — BP 144/67 | HR 66 | Ht 64.0 in | Wt 171.0 lb

## 2015-03-02 DIAGNOSIS — I7025 Atherosclerosis of native arteries of other extremities with ulceration: Secondary | ICD-10-CM | POA: Diagnosis not present

## 2015-03-02 NOTE — Progress Notes (Addendum)
Postoperative Visit   History of Present Illness  Christian Gibson is a 65 y.o. male who presents for postoperative follow-up from procedure on Date: 02/15/15: Ao, BRo .  The patient's wounds are not healed.  The R 5th MT ulcer is unchanged.  The patient has just started seeing Dr. Nils Pyle.  The patient is able to complete their activities of daily living.  The patient's current symptoms are: B foot swelling.  He denies fever, chills or drainage from R foot  Past Medical History, Past Surgical History, Social History, Family History, Medications, Allergies, and Review of Systems are unchanged from previous evaluation on 02/15/15.  On ROS: R foot ulcer, no fever or chills, no rest pain or intermittent claudication   For VQI Use Only  PRE-ADM LIVING: Home  AMB STATUS: Ambulatory  Physical Examination  Filed Vitals:   03/02/15 1005 03/02/15 1010  BP: 146/65 144/67  Pulse: 66   Height: 5\' 4"  (1.626 m)   Weight: 171 lb (77.565 kg)   SpO2: 95%    Body mass index is 29.34 kg/(m^2).  General: A&O x 3, WDWN  Pulmonary: Sym exp, good air movt, CTAB, no rales, rhonchi, & wheezing  Cardiac: RRR, Nl S1, S2, no Murmurs, rubs or gallops Vascular: Vessel Right Left  Radial Palpable Palpable  Brachial Palpable Palpable  Carotid Palpable, without bruit Palpable, without bruit  Aorta Not palpable N/A  Femoral Palpable Palpable  Popliteal Not palpable Not palpable  PT Not Palpable Not Palpable  DP Not Palpable Not Palpable   Gastrointestinal: soft, NTND, no G/R, no HSM, no masses, no CVAT B  Musculoskeletal: M/S 5/5 throughout , Extremities without ischemic changes except laceration in skin overlying R lateral 5th MT with black eschar base (appears contracted from previous), B cyanotic toes that change with elevation  Neurologic:  Pain and light touch intact in extremities except slightly decreased in both feet, Motor exam as listed above  Medical Decision  Making  [name] is a 65 y.o. male who presents s/p dx aortogram with bilateral leg runoff, BLE CVI . Based on his angiographic findings, this patient needs: continued wound care vs R pop-peroneal bypass. His peroneal artery is small, likely ~2 mm, so the success rate for such a distal target is limited.  Additionally, just dissecting out this peroneal target is likely to require ligation of multiple collaterals. In the worst case scenario, the peroneal artery is not an adequate target and the collaterals get ligated.  This would result in likely right foot loss due to ischemia. Subsequently, we discussed trying a month of wound care.  If there is no progress, then we will readdress the R pop-peroneal bypass. I again reiterated the need to stop smoking. I discussed in depth with the patient the nature of atherosclerosis, and emphasized the importance of maximal medical management including strict control of blood pressure, blood glucose, and lipid levels, obtaining regular exercise, and cessation of smoking.  The patient is aware that without maximal medical management the underlying atherosclerotic disease process will progress, limiting the benefit of any interventions. The patient is currently on a statin: Crestor. The patient is currently not on an anti-platelet: as the patient is on anticoagulation with Coumadin. The patient will follow up ine one month to recheck the R foot wound. He knows to follow up sooner if the wound worsens or he develops rest pain, drainage, or signs and sx of cellulitis. Thank you for allowing [place] to participate in this patient's care.  [name], MD [facility] Office: [phone] Pager: [phone]

## 2015-03-26 ENCOUNTER — Encounter: Payer: Self-pay | Admitting: Vascular Surgery

## 2015-03-30 ENCOUNTER — Ambulatory Visit: Payer: Medicare Other | Admitting: Vascular Surgery

## 2015-04-09 ENCOUNTER — Ambulatory Visit (INDEPENDENT_AMBULATORY_CARE_PROVIDER_SITE_OTHER): Payer: Medicare Other | Admitting: Ophthalmology

## 2015-04-09 DIAGNOSIS — E11319 Type 2 diabetes mellitus with unspecified diabetic retinopathy without macular edema: Secondary | ICD-10-CM

## 2015-04-09 DIAGNOSIS — E113593 Type 2 diabetes mellitus with proliferative diabetic retinopathy without macular edema, bilateral: Secondary | ICD-10-CM

## 2015-04-09 DIAGNOSIS — H35033 Hypertensive retinopathy, bilateral: Secondary | ICD-10-CM | POA: Diagnosis not present

## 2015-04-09 DIAGNOSIS — I10 Essential (primary) hypertension: Secondary | ICD-10-CM

## 2015-04-09 DIAGNOSIS — D3131 Benign neoplasm of right choroid: Secondary | ICD-10-CM

## 2015-04-09 DIAGNOSIS — H26491 Other secondary cataract, right eye: Secondary | ICD-10-CM | POA: Diagnosis not present

## 2015-04-12 ENCOUNTER — Encounter: Payer: Self-pay | Admitting: Vascular Surgery

## 2015-04-20 ENCOUNTER — Encounter: Payer: Self-pay | Admitting: Vascular Surgery

## 2015-04-20 ENCOUNTER — Other Ambulatory Visit: Payer: Self-pay | Admitting: *Deleted

## 2015-04-20 ENCOUNTER — Ambulatory Visit (INDEPENDENT_AMBULATORY_CARE_PROVIDER_SITE_OTHER): Payer: Medicare Other | Admitting: Vascular Surgery

## 2015-04-20 VITALS — BP 140/79 | HR 73 | Temp 97.7°F | Ht 64.0 in | Wt 171.5 lb

## 2015-04-20 DIAGNOSIS — I7025 Atherosclerosis of native arteries of other extremities with ulceration: Secondary | ICD-10-CM | POA: Diagnosis not present

## 2015-04-20 DIAGNOSIS — I739 Peripheral vascular disease, unspecified: Secondary | ICD-10-CM

## 2015-04-20 NOTE — Progress Notes (Signed)
Established Critical Limb Ischemia Patient  History of Present Illness  Christian Gibson is a 65 y.o. (03-09-1950) male who presents with chief complaint: healing R 5th MT.  The patient has no rest pain and wounds include: R 5th MT which is nearly healed.  The patient notes symptoms have not progressed.  The patient's treatment regimen currently included: maximal medical management and wound care which is wrapping up.  Wife also notes a new callous on heel.  The patient's PMH, PSH, SH, and FamHx are unchanged from 03/02/15.  Current Outpatient Prescriptions  Medication Sig Dispense Refill  . acetaminophen (TYLENOL) 325 MG tablet Take 650 mg by mouth as needed.    Marland Kitchen BREO ELLIPTA 100-25 MCG/INH AEPB Take 1 puff by mouth daily.     . Cyanocobalamin (RA VITAMIN B-12 TR) 1000 MCG TBCR Take 1 tablet by mouth daily.     Marland Kitchen glimepiride (AMARYL) 1 MG tablet Take 1 mg by mouth daily as needed (Sugar is high in mornings).     Marland Kitchen KOMBIGLYZE XR 2.05-998 MG TB24 Take 1 tablet by mouth 2 (two) times daily.     Marland Kitchen levothyroxine (SYNTHROID, LEVOTHROID) 25 MCG tablet Take 12.5 mcg by mouth daily before breakfast.     . lisinopril (PRINIVIL,ZESTRIL) 10 MG tablet Take 10 mg by mouth daily.     Marland Kitchen LYRICA 75 MG capsule Take 75 mg by mouth 2 (two) times daily.     . metoprolol (LOPRESSOR) 50 MG tablet Take 50 mg by mouth 2 (two) times daily.     . rosuvastatin (CRESTOR) 20 MG tablet Take 20 mg by mouth every evening.     Marland Kitchen SPIRIVA HANDIHALER 18 MCG inhalation capsule Place 18 mcg into inhaler and inhale daily.     . Vitamin D, Ergocalciferol, (DRISDOL) 50000 UNITS CAPS capsule Take 50,000 Units by mouth every 7 (seven) days. mondays    . warfarin (COUMADIN) 5 MG tablet Take 2.5-5 mg by mouth daily. Take half of a tablet (2.5mg ) on Mondays, Wednesdays and Fridays, and a whole tablet (5mg ) on all other days.    . cephALEXin (KEFLEX) 500 MG capsule 500 mg 3 (three) times daily. Reported on 04/20/2015    . ibuprofen  (ADVIL,MOTRIN) 200 MG tablet Take 400 mg by mouth daily as needed for mild pain. Reported on 04/20/2015     No current facility-administered medications for this visit.   Facility-Administered Medications Ordered in Other Visits  Medication Dose Route Frequency Provider Last Rate Last Dose  . ceFAZolin (ANCEF) IVPB 2 g/50 mL premix  2 g Intravenous 30 min Pre-Op [name], MD        No Known Allergies  On ROS today: no fever or chills, no drainage, no rest pain, continued B foot swelling   Physical Examination  Filed Vitals:   04/20/15 0954  BP: 140/79  Pulse: 73  Temp: 97.7 F (36.5 C)  TempSrc: Oral  Height: 5\' 4"  (1.626 m)  Weight: 171 lb 8 oz (77.792 kg)  SpO2: 94%   Body mass index is 29.42 kg/(m^2).  General: A&O x 3, WDWN  Eyes: PERRLA, EOMI  Pulmonary: Sym exp, good air movt, CTAB, no rales, rhonchi, & wheezing  Cardiac: RRR, Nl S1, S2, no Murmurs, rubs or gallops  Vascular: Vessel Right Left  Radial Palpable Palpable  Brachial Palpable Palpable  Carotid Palpable, without bruit Palpable, without bruit  Aorta Not palpable N/A  Femoral Palpable Palpable  Popliteal Not palpable Not palpable  PT Not  Palpable Not Palpable  DP Not Palpable Not Palpable   Gastrointestinal: soft, NTND, no G/R, no HSM, no masses, no CVAT B  Musculoskeletal: M/S 5/5 throughout , Extremities without ischemic changes , R 5th MT laceration nearly healed, small callous on heel, no gangrene, no ulcer, cyanotic toes, R foot swelling 1-2+, L foot swelling 1+  Neurologic: Pain and light touch intact in extremities , Motor exam as listed above    Medical Decision Making  Christian Gibson is a 65 y.o. male who presents with: R 5th MT ulcer healed, BLE CVI,    Based on the patient's vascular studies and examination, I have offered the patient: q3 month ABI.  I discussed in depth with the patient the nature of atherosclerosis, and emphasized the importance of maximal medical  management including strict control of blood pressure, blood glucose, and lipid levels, antiplatelet agents, obtaining regular exercise, and cessation of smoking.    The patient is aware that without maximal medical management the underlying atherosclerotic disease process will progress, limiting the benefit of any interventions. The patient is currently on a statin: Crestor. The patient is currently not on an anti-platelet: as on Warfarin.  Thank you for allowing [place] to participate in this patient's care.   [name], MD [facility] [place] [facility] Office: [phone] Pager: [phone]  04/20/2015, 10:23 AM

## 2015-05-11 ENCOUNTER — Encounter (INDEPENDENT_AMBULATORY_CARE_PROVIDER_SITE_OTHER): Payer: Medicare Other | Admitting: Ophthalmology

## 2015-05-11 DIAGNOSIS — H2701 Aphakia, right eye: Secondary | ICD-10-CM

## 2015-07-20 ENCOUNTER — Ambulatory Visit (HOSPITAL_COMMUNITY): Payer: Medicare Other

## 2015-07-20 ENCOUNTER — Ambulatory Visit: Payer: Medicare Other | Admitting: Vascular Surgery

## 2015-08-09 ENCOUNTER — Encounter: Payer: Self-pay | Admitting: Vascular Surgery

## 2015-08-16 ENCOUNTER — Ambulatory Visit (HOSPITAL_COMMUNITY)
Admission: RE | Admit: 2015-08-16 | Discharge: 2015-08-16 | Disposition: A | Discharge status: Home / Self Care | Payer: Medicare Other | Source: Ambulatory Visit | Attending: Vascular Surgery | Admitting: Vascular Surgery

## 2015-08-16 DIAGNOSIS — R938 Abnormal findings on diagnostic imaging of other specified body structures: Secondary | ICD-10-CM | POA: Diagnosis not present

## 2015-08-16 DIAGNOSIS — J449 Chronic obstructive pulmonary disease, unspecified: Secondary | ICD-10-CM | POA: Insufficient documentation

## 2015-08-16 DIAGNOSIS — I2510 Atherosclerotic heart disease of native coronary artery without angina pectoris: Secondary | ICD-10-CM | POA: Insufficient documentation

## 2015-08-16 DIAGNOSIS — E039 Hypothyroidism, unspecified: Secondary | ICD-10-CM | POA: Insufficient documentation

## 2015-08-16 DIAGNOSIS — E7800 Pure hypercholesterolemia, unspecified: Secondary | ICD-10-CM | POA: Insufficient documentation

## 2015-08-16 DIAGNOSIS — R0989 Other specified symptoms and signs involving the circulatory and respiratory systems: Secondary | ICD-10-CM | POA: Diagnosis present

## 2015-08-16 DIAGNOSIS — I10 Essential (primary) hypertension: Secondary | ICD-10-CM | POA: Diagnosis not present

## 2015-08-16 DIAGNOSIS — E119 Type 2 diabetes mellitus without complications: Secondary | ICD-10-CM | POA: Diagnosis not present

## 2015-08-16 DIAGNOSIS — I739 Peripheral vascular disease, unspecified: Secondary | ICD-10-CM | POA: Insufficient documentation

## 2015-08-16 NOTE — Progress Notes (Signed)
Established Critical Limb Ischemia Patient  History of Present Illness  Christian Gibson is a 65 y.o. (1950-11-15) male w/ prior healing R 5th MT who presents with chief complaint: routine follow-up.  The patient notes all of his foot wounds have healed with wound care with Dr. Nils Gibson.  The patient has no rest pain.  The patient notes symptoms have bit progressed.  The patient's treatment regimen currently included: maximal medical management.  The patient's PMH, PSH, SH, and FamHx are unchanged from 04/20/15.  Current Outpatient Prescriptions  Medication Sig Dispense Refill  . acetaminophen (TYLENOL) 325 MG tablet Take 650 mg by mouth as needed.    Marland Kitchen BREO ELLIPTA 100-25 MCG/INH AEPB Take 1 puff by mouth daily.     . cephALEXin (KEFLEX) 500 MG capsule 500 mg 3 (three) times daily. Reported on 04/20/2015    . Cyanocobalamin (RA VITAMIN B-12 TR) 1000 MCG TBCR Take 1 tablet by mouth daily.     Marland Kitchen glimepiride (AMARYL) 1 MG tablet Take 1 mg by mouth daily as needed (Sugar is high in mornings).     Marland Kitchen ibuprofen (ADVIL,MOTRIN) 200 MG tablet Take 400 mg by mouth daily as needed for mild pain. Reported on 04/20/2015    . KOMBIGLYZE XR 2.05-998 MG TB24 Take 1 tablet by mouth 2 (two) times daily.     Marland Kitchen levothyroxine (SYNTHROID, LEVOTHROID) 25 MCG tablet Take 12.5 mcg by mouth daily before breakfast.     . lisinopril (PRINIVIL,ZESTRIL) 10 MG tablet Take 10 mg by mouth daily.     Marland Kitchen LYRICA 75 MG capsule Take 75 mg by mouth 2 (two) times daily.     . metoprolol (LOPRESSOR) 50 MG tablet Take 50 mg by mouth 2 (two) times daily.     . rosuvastatin (CRESTOR) 20 MG tablet Take 20 mg by mouth every evening.     Marland Kitchen SPIRIVA HANDIHALER 18 MCG inhalation capsule Place 18 mcg into inhaler and inhale daily.     . Vitamin D, Ergocalciferol, (DRISDOL) 50000 UNITS CAPS capsule Take 50,000 Units by mouth every 7 (seven) days. mondays    . warfarin (COUMADIN) 5 MG tablet Take 2.5-5 mg by mouth daily. Take half of a tablet  (2.5mg ) on Mondays, Wednesdays and Fridays, and a whole tablet (5mg ) on all other days.     No current facility-administered medications for this visit.    Facility-Administered Medications Ordered in Other Visits  Medication Dose Route Frequency Provider Last Rate Last Dose  . ceFAZolin (ANCEF) IVPB 2 g/50 mL premix  2 g Intravenous 30 min Pre-Op Christian Pedro, MD        No Known Allergies  On ROS today: no paraesthesia, no gangrene or ulcers   Physical Examination  Vitals:   08/17/15 1400  BP: (!) 174/85  Pulse: 68  Resp: 20  Temp: 97.6 F (36.4 C)  TempSrc: Oral  SpO2: 95%  Weight: 164 lb (74.4 kg)  Height: 5\' 4"  (1.626 m)   Body mass index is 28.15 kg/m.  General: A&O x 3, WDWN  Eyes: PERRLA, EOMI  Pulmonary: Sym exp, good air movt, CTAB, no rales, rhonchi, & wheezing  Cardiac: RRR, Nl S1, S2, no rubs or gallops; +mechanical sound  Vascular: Vessel Right Left  Radial Palpable Palpable  Brachial Palpable Palpable  Carotid Palpable, without bruit; transmitted mechanical sound Palpable, without bruit; transmitted mechanical sound  Aorta Not palpable N/A  Femoral Palpable Palpable  Popliteal Not palpable Not palpable  PT Not Palpable Not Palpable  DP Not Palpable Not Palpable   Gastrointestinal: soft, NTND, no G/R, no HSM, no masses, no CVAT B  Musculoskeletal: M/S 5/5 throughout , Extremities without ischemic changes, all wounds healed, no gangrene, forefoot B cyanotic  Neurologic: Pain and light touch intact in extremities , Motor exam as listed above   Non-Invasive Vascular Imaging ABI (Date: 08/16/2015)  R: [place] ([place]), DP: mono, PT: mono, TBI: 0.37  L: [place] ([place]), DP: mono, PT: damp  mono, TBI: 0.30   Medical Decision Making  Christian Gibson is a 65 y.o. 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  B TBI are improved.  At this point, I emphasized maximal medical mgmt and good foot  hygiene and protective care.  Based on the patient's vascular studies and examination, I have offered the patient: q6 month ABI.  I discussed in depth with the patient the nature of atherosclerosis, and emphasized the importance of maximal medical management including strict control of blood pressure, blood glucose, and lipid levels, antiplatelet agents, obtaining regular exercise, and cessation of smoking.    The patient is aware that without maximal medical management the underlying atherosclerotic disease process will progress, limiting the benefit of any interventions. The patient is currently on a statin: Crestor. The patient is currently not on an anti-platelet.  The patient is on Warfarin.  Thank you for allowing [place] to participate in this patient's care.   Christian Barthel, MD, FACS [facility] Office: [phone] Pager: [phone]

## 2015-08-17 ENCOUNTER — Ambulatory Visit (INDEPENDENT_AMBULATORY_CARE_PROVIDER_SITE_OTHER): Payer: Medicare Other | Admitting: Vascular Surgery

## 2015-08-17 ENCOUNTER — Encounter: Payer: Self-pay | Admitting: Vascular Surgery

## 2015-08-17 VITALS — BP 174/85 | HR 68 | Temp 97.6°F | Resp 20 | Ht 64.0 in | Wt 164.0 lb

## 2015-08-17 DIAGNOSIS — I7025 Atherosclerosis of native arteries of other extremities with ulceration: Secondary | ICD-10-CM

## 2015-08-22 ENCOUNTER — Other Ambulatory Visit: Payer: Self-pay | Admitting: *Deleted

## 2015-08-22 DIAGNOSIS — I739 Peripheral vascular disease, unspecified: Secondary | ICD-10-CM

## 2016-02-18 ENCOUNTER — Encounter: Payer: Self-pay | Admitting: Vascular Surgery

## 2016-02-19 NOTE — Progress Notes (Signed)
Established Critical Limb Ischemia Patient  History of Present Illness  Christian Gibson is a 66 y.o. (02/26/1950) male w/ prior healing R 5th MT who presents with chief complaint: new wounds on R foot.  The patient notes he injured his R foot 3 weeks ago and is getting wound care with Dr. Nils Pyle again.  The patient has no rest pain.  The patient notes symptoms are pain with wound manipulation.  He otherwise is asx.  The patient's treatment regimen currently included: maximal medical management.  The patient's PMH, PSH, SH, and FamHx are unchanged from 08/17/15.  Current Outpatient Prescriptions  Medication Sig Dispense Refill  . acetaminophen (TYLENOL) 325 MG tablet Take 650 mg by mouth as needed.    Marland Kitchen BREO ELLIPTA 100-25 MCG/INH AEPB Take 1 puff by mouth daily.     . cephALEXin (KEFLEX) 500 MG capsule 500 mg 3 (three) times daily. Reported on 04/20/2015    . Cyanocobalamin (RA VITAMIN B-12 TR) 1000 MCG TBCR Take 1 tablet by mouth daily.     Marland Kitchen glimepiride (AMARYL) 1 MG tablet Take 1 mg by mouth daily as needed (Sugar is high in mornings).     Marland Kitchen ibuprofen (ADVIL,MOTRIN) 200 MG tablet Take 400 mg by mouth daily as needed for mild pain. Reported on 04/20/2015    . KOMBIGLYZE XR 2.05-998 MG TB24 Take 1 tablet by mouth 2 (two) times daily.     Marland Kitchen levothyroxine (SYNTHROID, LEVOTHROID) 25 MCG tablet Take 12.5 mcg by mouth daily before breakfast.     . lisinopril (PRINIVIL,ZESTRIL) 10 MG tablet Take 10 mg by mouth daily.     Marland Kitchen LYRICA 75 MG capsule Take 75 mg by mouth 2 (two) times daily.     . metoprolol (LOPRESSOR) 50 MG tablet Take 50 mg by mouth 2 (two) times daily.     . rosuvastatin (CRESTOR) 20 MG tablet Take 20 mg by mouth every evening.     Marland Kitchen SPIRIVA HANDIHALER 18 MCG inhalation capsule Place 18 mcg into inhaler and inhale daily.     . Vitamin D, Ergocalciferol, (DRISDOL) 50000 UNITS CAPS capsule Take 50,000 Units by mouth every 7 (seven) days. mondays    . warfarin (COUMADIN) 2.5 MG  tablet Take 2.5 mg by mouth daily. 2 mg Q M, W, T, F, S, S.  2.5mg  Q Tues.    . warfarin (COUMADIN) 5 MG tablet Take 2.5-5 mg by mouth daily. Take half of a tablet (2.5mg ) on Mondays, Wednesdays and Fridays, and a whole tablet (5mg ) on all other days.     No current facility-administered medications for this visit.    Facility-Administered Medications Ordered in Other Visits  Medication Dose Route Frequency Provider Last Rate Last Dose  . ceFAZolin (ANCEF) IVPB 2 g/50 mL premix  2 g Intravenous 30 min Pre-Op [name], MD        On ROS today: no paraesthesia, new R foot wounds   Physical Examination  Vitals:   02/22/16 1119 02/22/16 1127  BP: (!) 101/46 (!) 144/74  Pulse: 62   Resp: 18   Temp: 97 F (36.1 C)   TempSrc: Oral   SpO2: 96%   Weight: 165 lb 4.8 oz (75 kg)   Height: 5\' 4"  (1.626 m)     Body mass index is 28.37 kg/m.  General: A&O x 3, WDWN, ill appearing  Eyes: PERRLA, EOMI  Pulmonary: Sym exp, good air movt, CTAB, no rales, rhonchi, & wheezing  Cardiac: RRR, Nl S1, S2,  no rubs or gallops; +mechanical sound  Vascular: Vessel Right Left  Radial Palpable Palpable  Brachial Palpable Palpable  Carotid Palpable, without bruit; transmitted mechanical sound Palpable, without bruit; transmitted mechanical sound  Aorta Not palpable N/A  Femoral Palpable Palpable  Popliteal Not palpable Not palpable  PT Not Palpable Not Palpable  DP Not Palpable Not Palpable   Gastrointestinal: soft, NTND, no G/R, no HSM, no masses, no CVAT B  Musculoskeletal: M/S 5/5 throughout , Extremities without ischemic changes, R 5th MT small shallow ulcer, small laceration in right heel, small laceration over R 5th MT  Neurologic: Pain and light touch intact in extremities , Motor exam as listed above   Non-Invasive Vascular Imaging  ABI (Date: 02/19/2016)  R:   ABI: [place] ([place]),   DP: mono (unchg)  PT: mono (unchg)  L:   ABI: [place] ([place]),   DP: mono  PT:  mono (improved)   Medical Decision Making  Christian Gibson is a 66 y.o. 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  Any attempt at endovascular intervention would be a last ditch effort and there is no surgical target in this patient.  At this point, I recommend one month trial of wound care then follow up.  At this point, we'll consider proceeding with an attempt at endovascular recannulation of a tibioperoneal trunk if the wounds have not healed.  I discussed in depth with the patient the nature of atherosclerosis, and emphasized the importance of maximal medical management including strict control of blood pressure, blood glucose, and lipid levels, antiplatelet agents, obtaining regular exercise, and cessation of smoking.    The patient is aware that without maximal medical management the underlying atherosclerotic disease process will progress, limiting the benefit of any interventions.  The patient is currently on a statin: Crestor.  The patient is currently not on an anti-platelet.  The patient is on Warfarin.  Thank you for allowing [place] to participate in this patient's care.   [name], MD, FACS [facility] Office: [phone] Pager: [phone]

## 2016-02-22 ENCOUNTER — Ambulatory Visit (INDEPENDENT_AMBULATORY_CARE_PROVIDER_SITE_OTHER): Payer: Medicare Other | Admitting: Vascular Surgery

## 2016-02-22 ENCOUNTER — Encounter: Payer: Self-pay | Admitting: Vascular Surgery

## 2016-02-22 ENCOUNTER — Ambulatory Visit (HOSPITAL_COMMUNITY)
Admission: RE | Admit: 2016-02-22 | Discharge: 2016-02-22 | Disposition: A | Discharge status: Home / Self Care | Payer: Medicare Other | Source: Ambulatory Visit | Attending: Vascular Surgery | Admitting: Vascular Surgery

## 2016-02-22 VITALS — BP 144/74 | HR 62 | Temp 97.0°F | Resp 18 | Ht 64.0 in | Wt 165.3 lb

## 2016-02-22 DIAGNOSIS — I10 Essential (primary) hypertension: Secondary | ICD-10-CM | POA: Diagnosis not present

## 2016-02-22 DIAGNOSIS — E119 Type 2 diabetes mellitus without complications: Secondary | ICD-10-CM | POA: Insufficient documentation

## 2016-02-22 DIAGNOSIS — I739 Peripheral vascular disease, unspecified: Secondary | ICD-10-CM | POA: Insufficient documentation

## 2016-02-22 DIAGNOSIS — I7025 Atherosclerosis of native arteries of other extremities with ulceration: Secondary | ICD-10-CM | POA: Diagnosis not present

## 2016-03-14 ENCOUNTER — Encounter: Payer: Self-pay | Admitting: Vascular Surgery

## 2016-03-20 NOTE — Progress Notes (Signed)
Established Critical Limb Ischemia Patient  History of Present Illness  Christian Gibson is a 66 y.o. ([birth year]) male   w/ prior healing R 5th MT who presents with chief complaint: follow up on new wounds on R foot.  Pt has been seeing Dr. Nils Pyle for management of his new R foot ulcers.  This patient's previous angiogram demonstrate essential unreconstructable tibial disease.  Pt and wife feel the R foot looks better.  They deny any fever or chills or drainage.  The patient's PMH, PSH, SH, and FamHx are unchanged from 02/22/16.  Current Outpatient Prescriptions  Medication Sig Dispense Refill  . acetaminophen (TYLENOL) 325 MG tablet Take 650 mg by mouth as needed.    Marland Kitchen BREO ELLIPTA 100-25 MCG/INH AEPB Take 1 puff by mouth daily.     . carvedilol (COREG) 12.5 MG tablet Take 12.5 mg by mouth 2 (two) times daily with a meal.    . cephALEXin (KEFLEX) 500 MG capsule 500 mg 3 (three) times daily. Reported on 04/20/2015    . ciprofloxacin (CIPRO) 500 MG tablet Take 500 mg by mouth 2 (two) times daily.    . Cyanocobalamin (RA VITAMIN B-12 TR) 1000 MCG TBCR Take 1 tablet by mouth daily.     . furosemide (LASIX) 20 MG tablet Take 20 mg by mouth 2 (two) times daily.    Marland Kitchen glimepiride (AMARYL) 1 MG tablet Take 1 mg by mouth daily as needed (Sugar is high in mornings).     Marland Kitchen ibuprofen (ADVIL,MOTRIN) 200 MG tablet Take 400 mg by mouth daily as needed for mild pain. Reported on 04/20/2015    . KOMBIGLYZE XR 2.05-998 MG TB24 Take 1 tablet by mouth 2 (two) times daily.     Marland Kitchen levothyroxine (SYNTHROID, LEVOTHROID) 25 MCG tablet Take 12.5 mcg by mouth daily before breakfast.     . lisinopril (PRINIVIL,ZESTRIL) 10 MG tablet Take 10 mg by mouth daily.     Marland Kitchen LYRICA 75 MG capsule Take 75 mg by mouth 2 (two) times daily.     . metoprolol (LOPRESSOR) 50 MG tablet Take 50 mg by mouth 2 (two) times daily.     . rosuvastatin (CRESTOR) 20 MG tablet Take 20 mg by mouth every evening.     Marland Kitchen SPIRIVA HANDIHALER 18 MCG  inhalation capsule Place 18 mcg into inhaler and inhale daily.     . Vitamin D, Ergocalciferol, (DRISDOL) 50000 UNITS CAPS capsule Take 50,000 Units by mouth every 7 (seven) days. mondays    . warfarin (COUMADIN) 2.5 MG tablet Take 2 mg by mouth daily. As directed    . warfarin (COUMADIN) 5 MG tablet Take 2.5-5 mg by mouth daily. Take half of a tablet (2.5mg ) on Mondays, Wednesdays and Fridays, and a whole tablet (5mg ) on all other days.     No current facility-administered medications for this visit.    Facility-Administered Medications Ordered in Other Visits  Medication Dose Route Frequency Provider Last Rate Last Dose  . ceFAZolin (ANCEF) IVPB 2 g/50 mL premix  2 g Intravenous 30 min Pre-Op [name], MD        On ROS today: no rest pain, no fever or chills   Physical Examination  Vitals:   03/21/16 0906  BP: 139/78  Pulse: 62  Resp: 18  Temp: 97.4 F (36.3 C)  TempSrc: Oral  SpO2: 98%  Weight: 163 lb (73.9 kg)  Height: 5\' 4"  (1.626 m)     Body mass index is 27.98 kg/m.  General: A&O x 3, WDWN, ill appearing  Eyes: PERRLA, EOMI  Pulmonary: Sym exp, good air movt, CTAB, no rales, rhonchi, & wheezing  Cardiac: RRR, Nl S1, S2, no rubs or gallops; +mechanical sound  Vascular: Vessel Right Left  Radial Palpable Palpable  Brachial Palpable Palpable  Carotid Palpable, without bruit; transmitted mechanical sound Palpable, without bruit; transmitted mechanical sound  Aorta Not palpable N/A  Femoral Palpable Palpable  Popliteal Not palpable Not palpable  PT Not Palpable Not Palpable  DP Not Palpable Not Palpable   Gastrointestinal: soft, NTND, no G/R, no HSM, no masses, no CVAT B  Musculoskeletal: M/S 5/5 throughout , Extremities without ischemic changes, +rubor (R>L), small eschar in multiple location, 5th MT ulcer appears clean and smaller, no ascending erythema, no TTP  Neurologic: Pain and light touch intact in extremities , Motor exam as listed  above   Medical Decision Making  Christian Gibson is a 66 y.o. 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  Given the relatively lack of endovascular or surgical options, I would recommend continue with Wound Care.  The patient will follow up in 3 months for repeat ABI  I discussed in depth with the patient the nature of atherosclerosis, and emphasized the importance of maximal medical management including strict control of blood pressure, blood glucose, and lipid levels, antiplatelet agents, obtaining regular exercise, and cessation of smoking.   The patient is aware that without maximal medical management the underlying atherosclerotic disease process will progress, limiting the benefit of any interventions.  The patient is currently on a statin: Crestor.  The patient is currently noton an anti-platelet. The patient is on Warfarin.  Thank you for allowing [place] to participate in this patient's care.   [name], MD, FACS [facility] [place] Office: [phone] Pager: [phone]

## 2016-03-21 ENCOUNTER — Encounter: Payer: Self-pay | Admitting: Vascular Surgery

## 2016-03-21 ENCOUNTER — Ambulatory Visit (INDEPENDENT_AMBULATORY_CARE_PROVIDER_SITE_OTHER): Payer: Medicare Other | Admitting: Vascular Surgery

## 2016-03-21 VITALS — BP 139/78 | HR 62 | Temp 97.4°F | Resp 18 | Ht 64.0 in | Wt 163.0 lb

## 2016-03-21 DIAGNOSIS — I7025 Atherosclerosis of native arteries of other extremities with ulceration: Secondary | ICD-10-CM

## 2016-03-28 NOTE — Addendum Note (Signed)
Addended by: Radley Teston A on: 03/28/2016 09:47 AM   Modules accepted: Orders  

## 2016-05-01 ENCOUNTER — Encounter (HOSPITAL_COMMUNITY): Payer: Medicare Other

## 2016-05-01 NOTE — Progress Notes (Signed)
Established Critical Limb Ischemia Patient  History of Present Illness  Christian Gibson is a 66 y.o. (05-Dec-1950) male w/ prior healing R 5th MT who presents with chief complaint: follow up on new wounds on R foot. Pt has been seeing Dr. Nils Pyle for management of his new R foot ulcers.  Reported the R foot is getting worse.  The R 3rd toe developed a blister.  This patient's previous angiogram demonstrates essentially unreconstructable tibial disease.  The patient denies any rest pain or drainage.  He denies any fever or chills.   Past Medical History:  Diagnosis Date  . Arthritis   . COPD (chronic obstructive pulmonary disease) ([place])   . Coronary artery disease   . Diabetes mellitus without complication ([place])   . Heart murmur   . Hypercholesteremia   . Hypertension   . Hypothyroidism   . Myocardial infarction   . Nerve damage    right hand    Past Surgical History:  Procedure Laterality Date  . APPENDECTOMY    . CARDIAC CATHETERIZATION    . CARDIAC VALVE REPLACEMENT     aortic  . CAROTID ENDARTERECTOMY     left carotid artery  . CATARACT EXTRACTION     left eye  . CORONARY ARTERY BYPASS GRAFT    . EYE SURGERY    . HERNIA REPAIR    . JOINT REPLACEMENT    . PERIPHERAL VASCULAR CATHETERIZATION N/A 02/15/2015   Procedure: Abdominal Aortogram;  Surgeon: Conrad Buena Vista, MD;  Location: [facility];  Service: Cardiovascular;  Laterality: N/A;  . TONSILLECTOMY      Social History   Social History  . Marital status: Married    Spouse name: N/A  . Number of children: N/A  . Years of education: N/A   Occupational History  . Not on file.   Social History Main Topics  . Smoking status: Current Every Day Smoker    Packs/day: 1.00    Years: 48.00    Types: Cigarettes  . Smokeless tobacco: Never Used  . Alcohol use 0.0 oz/week     Comment: "beer occassionally"  . Drug use: No  . Sexual activity: Not on file   Other Topics Concern  . Not on file   Social  History Narrative  . No narrative on file    Family History  Problem Relation Age of Onset  . Stroke Mother   . Cancer Father   . Cancer Other   . Hypertension Other   . Diabetes Other     Current Outpatient Prescriptions  Medication Sig Dispense Refill  . acetaminophen (TYLENOL) 325 MG tablet Take 650 mg by mouth as needed.    Marland Kitchen BREO ELLIPTA 100-25 MCG/INH AEPB Take 1 puff by mouth daily.     . carvedilol (COREG) 12.5 MG tablet Take 12.5 mg by mouth 2 (two) times daily with a meal.    . Cyanocobalamin (RA VITAMIN B-12 TR) 1000 MCG TBCR Take 1 tablet by mouth daily.     . diclofenac (VOLTAREN) 75 MG EC tablet     . furosemide (LASIX) 20 MG tablet Take 20 mg by mouth 2 (two) times daily.    Marland Kitchen glimepiride (AMARYL) 1 MG tablet Take 1 mg by mouth daily as needed (Sugar is high in mornings).     Marland Kitchen KOMBIGLYZE XR 2.05-998 MG TB24 Take 1 tablet by mouth 2 (two) times daily.     Marland Kitchen levothyroxine (SYNTHROID, LEVOTHROID) 25 MCG tablet Take 12.5 mcg by  mouth daily before breakfast.     . lisinopril (PRINIVIL,ZESTRIL) 10 MG tablet Take 10 mg by mouth daily.     Marland Kitchen LYRICA 75 MG capsule Take 75 mg by mouth 2 (two) times daily.     . rosuvastatin (CRESTOR) 20 MG tablet Take 20 mg by mouth every evening.     Marland Kitchen SPIRIVA HANDIHALER 18 MCG inhalation capsule Place 18 mcg into inhaler and inhale daily.     . Vitamin D, Ergocalciferol, (DRISDOL) 50000 UNITS CAPS capsule Take 50,000 Units by mouth every 7 (seven) days. mondays    . warfarin (COUMADIN) 2.5 MG tablet Take 2 mg by mouth daily. As directed    . cephALEXin (KEFLEX) 500 MG capsule 500 mg 3 (three) times daily. Reported on 04/20/2015    . ciprofloxacin (CIPRO) 500 MG tablet Take 500 mg by mouth 2 (two) times daily.    Marland Kitchen ibuprofen (ADVIL,MOTRIN) 200 MG tablet Take 400 mg by mouth daily as needed for mild pain. Reported on 04/20/2015    . metoprolol (LOPRESSOR) 50 MG tablet Take 50 mg by mouth 2 (two) times daily.     Marland Kitchen warfarin (COUMADIN) 5 MG tablet  Take 2.5-5 mg by mouth daily. Take half of a tablet (2.5mg ) on Mondays, Wednesdays and Fridays, and a whole tablet (5mg ) on all other days.     No current facility-administered medications for this visit.    Facility-Administered Medications Ordered in Other Visits  Medication Dose Route Frequency Provider Last Rate Last Dose  . ceFAZolin (ANCEF) IVPB 2 g/50 mL premix  2 g Intravenous 30 min Pre-Op [name], MD         No Known Allergies   REVIEW OF SYSTEMS:  (Positives checked otherwise negative)  CARDIOVASCULAR:   [ ]  chest pain,  [ ]  chest pressure,  [ ]  palpitations,  [ ]  shortness of breath when laying flat,  [ ]  shortness of breath with exertion,   [x]  pain in feet when walking,  [ ]  pain in feet when laying flat, [ ]  history of blood clot in veins (DVT),  [ ]  history of phlebitis,  [ ]  swelling in legs,  [ ]  varicose veins  PULMONARY:   [ ]  productive cough,  [ ]  asthma,  [ ]  wheezing  NEUROLOGIC:   [ ]  weakness in arms or legs,  [ ]  numbness in arms or legs,  [ ]  difficulty speaking or slurred speech,  [ ]  temporary loss of vision in one eye,  [ ]  dizziness  HEMATOLOGIC:   [ ]  bleeding problems,  [ ]  problems with blood clotting too easily  MUSCULOSKEL:   [ ]  joint pain, [ ]  joint swelling  GASTROINTEST:   [ ]  vomiting blood,  [ ]  blood in stool     GENITOURINARY:   [ ]  burning with urination,  [ ]  blood in urine  PSYCHIATRIC:   [ ]  history of major depression  INTEGUMENTARY:   [ ]  rashes,  [x]  ulcers  CONSTITUTIONAL:   [ ]  fever,  [ ]  chills   Physical Examination  Vitals:   05/02/16 1256  BP: 135/68  Pulse: 64  Resp: 20  Temp: (!) 96.6 F (35.9 C)  TempSrc: Oral  SpO2: 96%  Weight: 162 lb (73.5 kg)  Height: 5\' 4"  (1.626 m)    Body mass index is 27.81 kg/m.  General: A&O x 3, WDWN, ill appearing  Eyes: PERRLA, EOMI  Pulmonary: Sym exp, good air movt, CTAB, no rales,  rhonchi, & wheezing  Cardiac: RRR, Nl  S1, S2, no rubs or gallops; +mechanical sound  Vascular: Vessel Right Left  Radial Palpable Palpable  Brachial Palpable Palpable  Carotid Palpable, without bruit; transmitted mechanical sound Palpable, without bruit; transmitted mechanical sound  Aorta Not palpable N/A  Femoral Palpable Palpable  Popliteal Not palpable Not palpable  PT Not palpable Not palpable  DP Not palpable Not palpable   Gastrointestinal: soft, NTND, no G/R, no HSM, no masses, no CVAT B  Musculoskeletal: M/S 5/5 throughout , L foot without gangrene or ulcer, +rubor (R>L), R foot swelling with some blanching, multiple small black eschar on plantar surface of R foot, ischemic distal phalange R 3rd toe,   Neurologic: Pain and light touch intact in extremities except B feet decreased sensation, Motor exam as listed above  ABI (Date: 05/02/2016)  R:   ABI: [place] ([place]),   DP: mono  PT: mono  L:   ABI: [place] ([place]),   DP: mono  PT: mono   Medical Decision Making  Muath Hallam is a 66 y.o. 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  I recommended: R leg runoff, attempt at tibial intervention. I discussed with the patient the nature of angiographic procedures, especially the limited patencies of any endovascular intervention.   The patient is aware of that the risks of an angiographic procedure include but are not limited to: bleeding, infection, access site complications, renal failure, embolization, rupture of vessel, dissection, arteriovenous fistula, possible need for emergent surgical intervention, possible need for surgical procedures to treat the patient's pathology, anaphylactic reaction to contrast, and stroke and death.   The patient is aware of the risks and  agrees to proceed.  He is scheduled for 19 APR 18 I discussed in depth with the patient the nature of atherosclerosis, and emphasized the importance of maximal medical management including strict control of blood pressure, blood glucose, and lipid levels, antiplatelet agents, obtaining regular exercise, and cessation of smoking.  The patient is aware that without maximal medical management the underlying atherosclerotic disease process will progress, limiting the benefit of any interventions. The patient is currently on a statin: Crestor. The patient is currently noton an anti-platelet. The patient is on Warfarin. Thank you for allowing [place] to participate in this patient's care.   [name], MD, FACS [facility] Office: [phone] Pager: [phone]

## 2016-05-02 ENCOUNTER — Other Ambulatory Visit: Payer: Self-pay

## 2016-05-02 ENCOUNTER — Encounter: Payer: Self-pay | Admitting: Vascular Surgery

## 2016-05-02 ENCOUNTER — Ambulatory Visit (INDEPENDENT_AMBULATORY_CARE_PROVIDER_SITE_OTHER): Payer: Medicare Other | Admitting: Vascular Surgery

## 2016-05-02 ENCOUNTER — Ambulatory Visit (HOSPITAL_COMMUNITY)
Admission: RE | Admit: 2016-05-02 | Discharge: 2016-05-02 | Disposition: A | Discharge status: Home / Self Care | Payer: Medicare Other | Source: Ambulatory Visit | Attending: Vascular Surgery | Admitting: Vascular Surgery

## 2016-05-02 VITALS — BP 135/68 | HR 64 | Temp 96.6°F | Resp 20 | Ht 64.0 in | Wt 162.0 lb

## 2016-05-02 DIAGNOSIS — I7025 Atherosclerosis of native arteries of other extremities with ulceration: Secondary | ICD-10-CM | POA: Diagnosis not present

## 2016-05-08 ENCOUNTER — Encounter (HOSPITAL_COMMUNITY)
Admission: RE | Disposition: A | Discharge status: Home / Self Care | Payer: Self-pay | Source: Ambulatory Visit | Attending: Vascular Surgery

## 2016-05-08 ENCOUNTER — Ambulatory Visit (HOSPITAL_COMMUNITY)
Admission: RE | Admit: 2016-05-08 | Discharge: 2016-05-08 | Disposition: A | Discharge status: Home / Self Care | Payer: Medicare Other | Source: Ambulatory Visit | Attending: Vascular Surgery | Admitting: Vascular Surgery

## 2016-05-08 DIAGNOSIS — Z951 Presence of aortocoronary bypass graft: Secondary | ICD-10-CM | POA: Insufficient documentation

## 2016-05-08 DIAGNOSIS — Z7984 Long term (current) use of oral hypoglycemic drugs: Secondary | ICD-10-CM | POA: Insufficient documentation

## 2016-05-08 DIAGNOSIS — I70235 Atherosclerosis of native arteries of right leg with ulceration of other part of foot: Secondary | ICD-10-CM | POA: Insufficient documentation

## 2016-05-08 DIAGNOSIS — E7800 Pure hypercholesterolemia, unspecified: Secondary | ICD-10-CM | POA: Insufficient documentation

## 2016-05-08 DIAGNOSIS — L97519 Non-pressure chronic ulcer of other part of right foot with unspecified severity: Secondary | ICD-10-CM | POA: Insufficient documentation

## 2016-05-08 DIAGNOSIS — F17210 Nicotine dependence, cigarettes, uncomplicated: Secondary | ICD-10-CM | POA: Insufficient documentation

## 2016-05-08 DIAGNOSIS — R51 Headache: Secondary | ICD-10-CM | POA: Insufficient documentation

## 2016-05-08 DIAGNOSIS — E11621 Type 2 diabetes mellitus with foot ulcer: Secondary | ICD-10-CM | POA: Insufficient documentation

## 2016-05-08 DIAGNOSIS — I252 Old myocardial infarction: Secondary | ICD-10-CM | POA: Insufficient documentation

## 2016-05-08 DIAGNOSIS — J449 Chronic obstructive pulmonary disease, unspecified: Secondary | ICD-10-CM | POA: Diagnosis not present

## 2016-05-08 DIAGNOSIS — M1990 Unspecified osteoarthritis, unspecified site: Secondary | ICD-10-CM | POA: Insufficient documentation

## 2016-05-08 DIAGNOSIS — Z952 Presence of prosthetic heart valve: Secondary | ICD-10-CM | POA: Insufficient documentation

## 2016-05-08 DIAGNOSIS — Z7901 Long term (current) use of anticoagulants: Secondary | ICD-10-CM | POA: Diagnosis not present

## 2016-05-08 DIAGNOSIS — I2510 Atherosclerotic heart disease of native coronary artery without angina pectoris: Secondary | ICD-10-CM | POA: Diagnosis not present

## 2016-05-08 DIAGNOSIS — E039 Hypothyroidism, unspecified: Secondary | ICD-10-CM | POA: Insufficient documentation

## 2016-05-08 DIAGNOSIS — E1151 Type 2 diabetes mellitus with diabetic peripheral angiopathy without gangrene: Secondary | ICD-10-CM | POA: Diagnosis present

## 2016-05-08 DIAGNOSIS — I7025 Atherosclerosis of native arteries of other extremities with ulceration: Secondary | ICD-10-CM | POA: Diagnosis present

## 2016-05-08 DIAGNOSIS — I7092 Chronic total occlusion of artery of the extremities: Secondary | ICD-10-CM | POA: Diagnosis not present

## 2016-05-08 DIAGNOSIS — I10 Essential (primary) hypertension: Secondary | ICD-10-CM | POA: Insufficient documentation

## 2016-05-08 HISTORY — PX: ABDOMINAL AORTOGRAM: CATH118222

## 2016-05-08 HISTORY — PX: LOWER EXTREMITY ANGIOGRAPHY: CATH118251

## 2016-05-08 HISTORY — PX: PERIPHERAL VASCULAR BALLOON ANGIOPLASTY: CATH118281

## 2016-05-08 LAB — POCT I-STAT, CHEM 8
BUN: 20 mg/dL (ref 6–20)
CHLORIDE: 87 mmol/L — AB (ref 101–111)
CREATININE: 1.2 mg/dL (ref 0.61–1.24)
Calcium, Ion: 1.08 mmol/L — ABNORMAL LOW (ref 1.15–1.40)
Glucose, Bld: 90 mg/dL (ref 65–99)
HEMATOCRIT: 42 % (ref 39.0–52.0)
Hemoglobin: 14.3 g/dL (ref 13.0–17.0)
POTASSIUM: 4.1 mmol/L (ref 3.5–5.1)
SODIUM: 125 mmol/L — AB (ref 135–145)
TCO2: 29 mmol/L (ref 0–100)

## 2016-05-08 LAB — POCT ACTIVATED CLOTTING TIME
ACTIVATED CLOTTING TIME: 257 s
ACTIVATED CLOTTING TIME: 213 s
Activated Clotting Time: 274 seconds
Activated Clotting Time: 175 seconds

## 2016-05-08 LAB — PROTIME-INR
INR: 1.21
Prothrombin Time: 15.4 seconds — ABNORMAL HIGH (ref 11.4–15.2)

## 2016-05-08 SURGERY — PERIPHERAL VASCULAR BALLOON ANGIOPLASTY
Anesthesia: LOCAL | Laterality: Right

## 2016-05-08 MED ORDER — FENTANYL CITRATE (PF) 100 MCG/2ML IJ SOLN
INTRAMUSCULAR | Status: DC | PRN
Start: 2016-05-08 — End: 2016-05-08
  Administered 2016-05-08: 25 ug via INTRAVENOUS

## 2016-05-08 MED ORDER — CLOPIDOGREL BISULFATE 300 MG PO TABS
ORAL_TABLET | ORAL | Status: AC
  Administered 2016-05-08: 300 mg via ORAL
  Filled 2016-05-08: qty 1

## 2016-05-08 MED ORDER — MIDAZOLAM HCL 2 MG/2ML IJ SOLN
INTRAMUSCULAR | Status: DC | PRN
  Administered 2016-05-08: 0.5 mg via INTRAVENOUS

## 2016-05-08 MED ORDER — SODIUM CHLORIDE 0.9 % IV SOLN: 250.0000 mL | INTRAVENOUS | Status: DC | PRN

## 2016-05-08 MED ORDER — HEPARIN SODIUM (PORCINE) 1000 UNIT/ML IJ SOLN
INTRAMUSCULAR | Status: DC | PRN
  Administered 2016-05-08: 8000 [IU] via INTRAVENOUS

## 2016-05-08 MED ORDER — HEPARIN (PORCINE) IN NACL 2-0.9 UNIT/ML-% IJ SOLN
INTRAMUSCULAR | Status: AC
  Filled 2016-05-08: qty 1000

## 2016-05-08 MED ORDER — CLOPIDOGREL BISULFATE 75 MG PO TABS
300.0000 mg | ORAL_TABLET | Freq: Once | ORAL | Status: AC
  Administered 2016-05-08: 300 mg via ORAL

## 2016-05-08 MED ORDER — SODIUM CHLORIDE 0.9% FLUSH: 3.0000 mL | Freq: Two times a day (BID) | INTRAVENOUS | Status: DC

## 2016-05-08 MED ORDER — SODIUM CHLORIDE 0.9% FLUSH: 3.0000 mL | INTRAVENOUS | Status: DC | PRN

## 2016-05-08 MED ORDER — GUAIFENESIN-DM 100-10 MG/5ML PO SYRP
5.0000 mL | ORAL_SOLUTION | ORAL | Status: DC | PRN
  Administered 2016-05-08: 5 mL via ORAL
  Filled 2016-05-08 (×2): qty 5

## 2016-05-08 MED ORDER — MIDAZOLAM HCL 2 MG/2ML IJ SOLN
INTRAMUSCULAR | Status: AC
  Filled 2016-05-08: qty 2

## 2016-05-08 MED ORDER — CLOPIDOGREL BISULFATE 75 MG PO TABS: 75.0000 mg | ORAL_TABLET | Freq: Every day | ORAL | 0 refills | Status: DC

## 2016-05-08 MED ORDER — MORPHINE SULFATE (PF) 4 MG/ML IV SOLN: 2.0000 mg | INTRAVENOUS | Status: DC | PRN

## 2016-05-08 MED ORDER — FENTANYL CITRATE (PF) 100 MCG/2ML IJ SOLN
INTRAMUSCULAR | Status: AC
  Filled 2016-05-08: qty 2

## 2016-05-08 MED ORDER — HEPARIN (PORCINE) IN NACL 2-0.9 UNIT/ML-% IJ SOLN
INTRAMUSCULAR | Status: DC | PRN
  Administered 2016-05-08: 1000 mL

## 2016-05-08 MED ORDER — ACETAMINOPHEN 325 MG PO TABS
650.0000 mg | ORAL_TABLET | ORAL | Status: DC | PRN
  Administered 2016-05-08: 650 mg via ORAL

## 2016-05-08 MED ORDER — IODIXANOL 320 MG/ML IV SOLN
INTRAVENOUS | Status: DC | PRN
  Administered 2016-05-08: 85 mL via INTRA_ARTERIAL

## 2016-05-08 MED ORDER — ACETAMINOPHEN 325 MG PO TABS
ORAL_TABLET | ORAL | Status: AC
  Administered 2016-05-08: 650 mg via ORAL
  Filled 2016-05-08: qty 2

## 2016-05-08 MED ORDER — LIDOCAINE HCL 1 % IJ SOLN
INTRAMUSCULAR | Status: AC
  Filled 2016-05-08: qty 20

## 2016-05-08 MED ORDER — LIDOCAINE HCL (PF) 1 % IJ SOLN
INTRAMUSCULAR | Status: DC | PRN
  Administered 2016-05-08: 18 mL via SUBCUTANEOUS

## 2016-05-08 MED ORDER — HEPARIN SODIUM (PORCINE) 1000 UNIT/ML IJ SOLN
INTRAMUSCULAR | Status: AC
  Filled 2016-05-08: qty 1

## 2016-05-08 MED ORDER — OXYCODONE-ACETAMINOPHEN 5-325 MG PO TABS: 1.0000 | ORAL_TABLET | ORAL | Status: DC | PRN

## 2016-05-08 MED ORDER — SODIUM CHLORIDE 0.9 % IV SOLN
INTRAVENOUS | Status: DC
  Administered 2016-05-08: 08:00:00 via INTRAVENOUS

## 2016-05-08 SURGICAL SUPPLY — 17 items
BALLN COYOTE OTW 2X150X150 (BALLOONS) ×3
BALLOON COYOTE OTW 2X150X150 (BALLOONS) ×2 IMPLANT
CATH OMNI FLUSH 5F 65CM (CATHETERS) ×3 IMPLANT
CATH QUICKCROSS .035X135CM (MICROCATHETER) ×3 IMPLANT
CATH STRAIGHT 5FR 65CM (CATHETERS) ×3 IMPLANT
DEVICE TORQUE .025-.038 (MISCELLANEOUS) ×3 IMPLANT
GUIDEWIRE ANGLED .035X260CM (WIRE) ×3 IMPLANT
KIT ENCORE 26 ADVANTAGE (KITS) ×6 IMPLANT
KIT MICROINTRODUCER STIFF 5F (SHEATH) ×3 IMPLANT
KIT PV (KITS) ×3 IMPLANT
SHEATH PINNACLE 5F 10CM (SHEATH) ×3 IMPLANT
SHEATH PINNACLE ST 6F 45CM (SHEATH) ×3 IMPLANT
SYR MEDRAD MARK V 150ML (SYRINGE) ×3 IMPLANT
TRANSDUCER W/STOPCOCK (MISCELLANEOUS) ×3 IMPLANT
TRAY PV CATH (CUSTOM PROCEDURE TRAY) ×3 IMPLANT
WIRE BENTSON .035X145CM (WIRE) ×3 IMPLANT
WIRE ROSEN-J .035X180CM (WIRE) ×3 IMPLANT

## 2016-05-08 NOTE — Progress Notes (Signed)
Na 125 reported to Rennis Harding, RN who states she will report to Dr Bridgett Larsson

## 2016-05-08 NOTE — Progress Notes (Signed)
Report received from [facility]. Care assumed at this time.  Pt's groin is stable and soft without bleeding. Small bruise noted at bottom (R) corner of dsg that is not increasing. Pt states his headache is getting better.

## 2016-05-08 NOTE — Op Note (Signed)
OPERATIVE NOTE   PROCEDURE: 1.  left common femoral artery cannulation under ultrasound guidance 2.  Placement of catheter in aorta 3.  Aortogram 4.  Second order arterial selection 5.  Right leg runoff 6.  Angioplasty of right peroneal artery (2 mm x 150 mm) 7.  Conscious sedation for 50 minutes  PRE-OPERATIVE DIAGNOSIS: right foot ulcers  POST-OPERATIVE DIAGNOSIS: same as above   SURGEON: [name], MD  ANESTHESIA: conscious sedation  ESTIMATED BLOOD LOSS: 50 cc  CONTRAST: 87 cc  FINDING(S):  Heavy calcification of entire arterial system  Aorta: patent  Superior mesenteric artery: patent   Right Left  RA patent patent  CIA patent patent  EIA patent patent  IIA patent patent  CFA patent   SFA patent   PFA patent   Pop patent   Trif Patent but small   AT Occluded   Pero Occluded 1-2 cm segment: success recannulation after angioplasty   PT Occluded    SPECIMEN(S):  none  INDICATIONS:   Christian Gibson is a 66 y.o. male who presents with previously known extensive tibial disease in the setting prior healed right foot ulcers.  The patient developed additional foot ulcer which have not been healing.  The patient presents for: aortogram, right leg runoff, and possible tibial intervention.  I discussed with the patient the nature of angiographic procedures, especially the limited patencies of any endovascular intervention.  The patient is aware of that the risks of an angiographic procedure include but are not limited to: bleeding, infection, access site complications, renal failure, embolization, rupture of vessel, dissection, possible need for emergent surgical intervention, possible need for surgical procedures to treat the patient's pathology, and stroke and death.  The patient is aware of the risks and agrees to proceed.  DESCRIPTION: After full informed consent was obtained from the patient, the patient was brought back to the angiography suite.  The patient was  placed supine upon the angiography table and connected to cardiopulmonary monitoring equipment.  The patient was then given conscious sedation, the amounts of which are documented in the patient's chart.  A circulating radiologic technician maintained continuous monitoring of the patient's cardiopulmonary status.  Additionally, the control room radiologic technician provided backup monitoring throughout the procedure.  The patient was prepped and drape in the standard fashion for an angiographic procedure.  At this point, attention was turned to the left groin.  Under ultrasound guidance, the subcutaneous tissue surrounding the left common femoral artery was anesthesized with 1% lidocaine with epinephrine.  The artery was then cannulated with a micropuncture needle.  The microwire was advanced into the iliac arterial system.  The needle was exchanged for a microsheath, which was loaded into the common femoral artery over the wire.  The microwire was exchanged for a Bentson wire which was advanced into the aorta.  The microsheath was then exchanged for a 5-Fr sheath which was loaded into the common femoral artery.  The Omniflush catheter was then loaded over the wire up to the level of L1.  The catheter was connected to the power injector circuit.  After de-airring and de-clotting the circuit, a power injector aortogram was completed.  The findings are listed above.  Using a Bentson wire and Omniflush catheter, the right common iliac artery was selected.  The wire was advanced into the right common femoral artery but the catheter would not advance, so I exchanged it for a straight catheter, which I lodged into the distal external iliac artery.  An  automated right leg runoff was completed to re-evaluate the left leg arterial system for additional occlusions.  The findings are listed above.  Based on the images, I felt an attempt at revascularization of the peroneal artery was indicated.  The wire was exchanged  for a Rosen wire.  The patient's left femoral sheath was exchanged for a 6-Fr Destination sheath, which was lodged in the right superficial femoral artery.  The dilator was removed.  The patient was given 8000 units of Heparin intravenously, which was a therapeutic bolus.  I loaded a long Quickcross catheter over the wire and then exchanged the wire for a long Glidewire.  Using this combination, I was able to navigate into the at-the-knee popliteal artery segment.  I did a hand injection in multiple projections to roll the collaterals off the tibioperoneal trunk.  These demonstrated a short segment peroneal artery occlusion.  I exchanged the wire for 18 gm Victory 0.014" dissection wire.  I used this wire to cross the total occlusion.  I did a hand injection to verify re-entry into the mid-segment peroneal artery.  I selected a 2 mm x 150 mm angioplasty balloon and advanced it well into the peroneal artery with overlap into the popliteal artery.  This was inflated at 12 atm for 2 minutes.  The balloon was deflated and removed.  Hand injections on the entire calf demonstrated successful recannulation of the entire peroneal artery with collateral runoff at the distal end into the foot.    At this point, the wire was pulled out and the sheath pulled back into the left external iliac artery.  The sheath was aspirated.  No clots were present and the sheath was reloaded with heparinized saline.     COMPLICATIONS: none  CONDITION: stable   [name], MD, [facility] Office: [phone] Pager: [phone]  05/08/2016, 11:25 AM

## 2016-05-08 NOTE — H&P (View-Only) (Signed)
Established Critical Limb Ischemia Patient  History of Present Illness  Christian Gibson is a 66 y.o. (17-Feb-1950) male w/ prior healing R 5th MT who presents with chief complaint: follow up on new wounds on R foot. Pt has been seeing Dr. Nils Pyle for management of his new R foot ulcers.  Reported the R foot is getting worse.  The R 3rd toe developed a blister.  This patient's previous angiogram demonstrates essentially unreconstructable tibial disease.  The patient denies any rest pain or drainage.  He denies any fever or chills.   Past Medical History:  Diagnosis Date  . Arthritis   . COPD (chronic obstructive pulmonary disease) ([place])   . Coronary artery disease   . Diabetes mellitus without complication ([place])   . Heart murmur   . Hypercholesteremia   . Hypertension   . Hypothyroidism   . Myocardial infarction   . Nerve damage    right hand    Past Surgical History:  Procedure Laterality Date  . APPENDECTOMY    . CARDIAC CATHETERIZATION    . CARDIAC VALVE REPLACEMENT     aortic  . CAROTID ENDARTERECTOMY     left carotid artery  . CATARACT EXTRACTION     left eye  . CORONARY ARTERY BYPASS GRAFT    . EYE SURGERY    . HERNIA REPAIR    . JOINT REPLACEMENT    . PERIPHERAL VASCULAR CATHETERIZATION N/A 02/15/2015   Procedure: Abdominal Aortogram;  Surgeon: Conrad Zeeland, MD;  Location: [facility];  Service: Cardiovascular;  Laterality: N/A;  . TONSILLECTOMY      Social History   Social History  . Marital status: Married    Spouse name: N/A  . Number of children: N/A  . Years of education: N/A   Occupational History  . Not on file.   Social History Main Topics  . Smoking status: Current Every Day Smoker    Packs/day: 1.00    Years: 48.00    Types: Cigarettes  . Smokeless tobacco: Never Used  . Alcohol use 0.0 oz/week     Comment: "beer occassionally"  . Drug use: No  . Sexual activity: Not on file   Other Topics Concern  . Not on file   Social  History Narrative  . No narrative on file    Family History  Problem Relation Age of Onset  . Stroke Mother   . Cancer Father   . Cancer Other   . Hypertension Other   . Diabetes Other     Current Outpatient Prescriptions  Medication Sig Dispense Refill  . acetaminophen (TYLENOL) 325 MG tablet Take 650 mg by mouth as needed.    Marland Kitchen BREO ELLIPTA 100-25 MCG/INH AEPB Take 1 puff by mouth daily.     . carvedilol (COREG) 12.5 MG tablet Take 12.5 mg by mouth 2 (two) times daily with a meal.    . Cyanocobalamin (RA VITAMIN B-12 TR) 1000 MCG TBCR Take 1 tablet by mouth daily.     . diclofenac (VOLTAREN) 75 MG EC tablet     . furosemide (LASIX) 20 MG tablet Take 20 mg by mouth 2 (two) times daily.    Marland Kitchen glimepiride (AMARYL) 1 MG tablet Take 1 mg by mouth daily as needed (Sugar is high in mornings).     Marland Kitchen KOMBIGLYZE XR 2.05-998 MG TB24 Take 1 tablet by mouth 2 (two) times daily.     Marland Kitchen levothyroxine (SYNTHROID, LEVOTHROID) 25 MCG tablet Take 12.5 mcg by  mouth daily before breakfast.     . lisinopril (PRINIVIL,ZESTRIL) 10 MG tablet Take 10 mg by mouth daily.     Marland Kitchen LYRICA 75 MG capsule Take 75 mg by mouth 2 (two) times daily.     . rosuvastatin (CRESTOR) 20 MG tablet Take 20 mg by mouth every evening.     Marland Kitchen SPIRIVA HANDIHALER 18 MCG inhalation capsule Place 18 mcg into inhaler and inhale daily.     . Vitamin D, Ergocalciferol, (DRISDOL) 50000 UNITS CAPS capsule Take 50,000 Units by mouth every 7 (seven) days. mondays    . warfarin (COUMADIN) 2.5 MG tablet Take 2 mg by mouth daily. As directed    . cephALEXin (KEFLEX) 500 MG capsule 500 mg 3 (three) times daily. Reported on 04/20/2015    . ciprofloxacin (CIPRO) 500 MG tablet Take 500 mg by mouth 2 (two) times daily.    Marland Kitchen ibuprofen (ADVIL,MOTRIN) 200 MG tablet Take 400 mg by mouth daily as needed for mild pain. Reported on 04/20/2015    . metoprolol (LOPRESSOR) 50 MG tablet Take 50 mg by mouth 2 (two) times daily.     Marland Kitchen warfarin (COUMADIN) 5 MG tablet  Take 2.5-5 mg by mouth daily. Take half of a tablet (2.5mg ) on Mondays, Wednesdays and Fridays, and a whole tablet (5mg ) on all other days.     No current facility-administered medications for this visit.    Facility-Administered Medications Ordered in Other Visits  Medication Dose Route Frequency Provider Last Rate Last Dose  . ceFAZolin (ANCEF) IVPB 2 g/50 mL premix  2 g Intravenous 30 min Pre-Op [name], MD         No Known Allergies   REVIEW OF SYSTEMS:  (Positives checked otherwise negative)  CARDIOVASCULAR:   [ ]  chest pain,  [ ]  chest pressure,  [ ]  palpitations,  [ ]  shortness of breath when laying flat,  [ ]  shortness of breath with exertion,   [x]  pain in feet when walking,  [ ]  pain in feet when laying flat, [ ]  history of blood clot in veins (DVT),  [ ]  history of phlebitis,  [ ]  swelling in legs,  [ ]  varicose veins  PULMONARY:   [ ]  productive cough,  [ ]  asthma,  [ ]  wheezing  NEUROLOGIC:   [ ]  weakness in arms or legs,  [ ]  numbness in arms or legs,  [ ]  difficulty speaking or slurred speech,  [ ]  temporary loss of vision in one eye,  [ ]  dizziness  HEMATOLOGIC:   [ ]  bleeding problems,  [ ]  problems with blood clotting too easily  MUSCULOSKEL:   [ ]  joint pain, [ ]  joint swelling  GASTROINTEST:   [ ]  vomiting blood,  [ ]  blood in stool     GENITOURINARY:   [ ]  burning with urination,  [ ]  blood in urine  PSYCHIATRIC:   [ ]  history of major depression  INTEGUMENTARY:   [ ]  rashes,  [x]  ulcers  CONSTITUTIONAL:   [ ]  fever,  [ ]  chills   Physical Examination  Vitals:   05/02/16 1256  BP: 135/68  Pulse: 64  Resp: 20  Temp: (!) 96.6 F (35.9 C)  TempSrc: Oral  SpO2: 96%  Weight: 162 lb (73.5 kg)  Height: 5\' 4"  (1.626 m)    Body mass index is 27.81 kg/m.  General: A&O x 3, WDWN, ill appearing  Eyes: PERRLA, EOMI  Pulmonary: Sym exp, good air movt, CTAB, no rales,  rhonchi, & wheezing  Cardiac: RRR, Nl  S1, S2, no rubs or gallops; +mechanical sound  Vascular: Vessel Right Left  Radial Palpable Palpable  Brachial Palpable Palpable  Carotid Palpable, without bruit; transmitted mechanical sound Palpable, without bruit; transmitted mechanical sound  Aorta Not palpable N/A  Femoral Palpable Palpable  Popliteal Not palpable Not palpable  PT Not palpable Not palpable  DP Not palpable Not palpable   Gastrointestinal: soft, NTND, no G/R, no HSM, no masses, no CVAT B  Musculoskeletal: M/S 5/5 throughout , L foot without gangrene or ulcer, +rubor (R>L), R foot swelling with some blanching, multiple small black eschar on plantar surface of R foot, ischemic distal phalange R 3rd toe,   Neurologic: Pain and light touch intact in extremities except B feet decreased sensation, Motor exam as listed above  ABI (Date: 05/02/2016)  R:   ABI: [place] ([place]),   DP: mono  PT: mono  L:   ABI: [place] ([place]),   DP: mono  PT: mono   Medical Decision Making  Christian Gibson is a 66 y.o. 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  I recommended: R leg runoff, attempt at tibial intervention. I discussed with the patient the nature of angiographic procedures, especially the limited patencies of any endovascular intervention.   The patient is aware of that the risks of an angiographic procedure include but are not limited to: bleeding, infection, access site complications, renal failure, embolization, rupture of vessel, dissection, arteriovenous fistula, possible need for emergent surgical intervention, possible need for surgical procedures to treat the patient's pathology, anaphylactic reaction to contrast, and stroke and death.   The patient is aware of the risks and  agrees to proceed.  He is scheduled for 19 APR 18 I discussed in depth with the patient the nature of atherosclerosis, and emphasized the importance of maximal medical management including strict control of blood pressure, blood glucose, and lipid levels, antiplatelet agents, obtaining regular exercise, and cessation of smoking.  The patient is aware that without maximal medical management the underlying atherosclerotic disease process will progress, limiting the benefit of any interventions. The patient is currently on a statin: Crestor. The patient is currently noton an anti-platelet. The patient is on Warfarin. Thank you for allowing [place] to participate in this patient's care.   [name], MD, FACS [facility] Office: [phone] Pager: [phone]

## 2016-05-08 NOTE — Interval H&P Note (Signed)
History and Physical Interval Note:  05/08/2016 7:08 AM  Christian Gibson  has presented today for surgery, with the diagnosis of pad   3rd toe ulcer  The various methods of treatment have been discussed with the patient and family. After consideration of risks, benefits and other options for treatment, the patient has consented to  Procedure(s): Lower Extremity Angiography  rt leg possible tibial pta (N/A) as a surgical intervention .  The patient's history has been reviewed, patient examined, no change in status, stable for surgery.  I have reviewed the patient's chart and labs.  Questions were answered to the patient's satisfaction.     [name]

## 2016-05-08 NOTE — Progress Notes (Signed)
Site area: Left  Groin a 6 french arterial sheath was removed  Site Prior to Removal:  Level 0  Pressure Applied For 20 MINUTES    Bedrest Beginning at 1345p  Manual:   Yes.    Patient Status During Pull:  stable  Post Pull Groin Site:  Level 0  Post Pull Instructions Given:  Yes.    Post Pull Pulses Present:  Yes.    Dressing Applied:  Yes.  Pressure dressing applied  Comments:  VS remain stable during sheath pull

## 2016-05-08 NOTE — Discharge Instructions (Signed)
Femoral Site Care °Refer to this sheet in the next few weeks. These instructions provide you with information about caring for yourself after your procedure. Your health care provider may also give you more specific instructions. Your treatment has been planned according to current medical practices, but problems sometimes occur. Call your health care provider if you have any problems or questions after your procedure. °What can I expect after the procedure? °After your procedure, it is typical to have the following: °· Bruising at the site that usually fades within 1-2 weeks. °· Blood collecting in the tissue (hematoma) that may be painful to the touch. It should usually decrease in size and tenderness within 1-2 weeks. °Follow these instructions at home: °· Take medicines only as directed by your health care provider. °· You may shower 24-48 hours after the procedure or as directed by your health care provider. Remove the bandage (dressing) and gently wash the site with plain soap and water. Pat the area dry with a clean towel. Do not rub the site, because this may cause bleeding. °· Do not take baths, swim, or use a hot tub until your health care provider approves. °· Check your insertion site every day for redness, swelling, or drainage. °· Do not apply powder or lotion to the site. °· Limit use of stairs to twice a day for the first 2-3 days or as directed by your health care provider. °· Do not squat for the first 2-3 days or as directed by your health care provider. °· Do not lift over 10 lb (4.5 kg) for 5 days after your procedure or as directed by your health care provider. °· Ask your health care provider when it is okay to: °¨ Return to work or school. °¨ Resume usual physical activities or sports. °¨ Resume sexual activity. °· Do not drive home if you are discharged the same day as the procedure. Have someone else drive you. °· You may drive 24 hours after the procedure unless otherwise instructed by  your health care provider. °· Do not operate machinery or power tools for 24 hours after the procedure or as directed by your health care provider. °· If your procedure was done as an outpatient procedure, which means that you went home the same day as your procedure, a responsible adult should be with you for the first 24 hours after you arrive home. °· Keep all follow-up visits as directed by your health care provider. This is important. °Contact a health care provider if: °· You have a fever. °· You have chills. °· You have increased bleeding from the site. Hold pressure on the site. °Get help right away if: °· You have unusual pain at the site. °· You have redness, warmth, or swelling at the site. °· You have drainage (other than a small amount of blood on the dressing) from the site. °· The site is bleeding, and the bleeding does not stop after 30 minutes of holding steady pressure on the site. °· Your leg or foot becomes pale, cool, tingly, or numb. °This information is not intended to replace advice given to you by your health care provider. Make sure you discuss any questions you have with your health care provider. °Document Released: 09/09/2013 Document Revised: 06/14/2015 Document Reviewed: 07/26/2013 °Elsevier Interactive Patient Education © 2017 [employer] ° °

## 2016-05-08 NOTE — Progress Notes (Signed)
Patient complained of feeling pressure in left groin area and site assessed immediately.  Groin site assessed at level 1 with a golf ball sized hematoma in area of sheath pull.  Pressure held on area for 30 minutes from 1420 until 1450 and Dr.Chen notified during pressure  Manual pressure hold to left groin.  Received order to continue bedrest an additional hour.  Bleeding in area resolved and site currently at level zero and soft to touch with minimal bruising.  Gave tylenol for headache along with  Plavix as ordered and cough syrup for coughing. Patient in stable condition with no complaints currently and will continue to monitor.  Reporting off to Benita Gutter RN.

## 2016-05-09 ENCOUNTER — Telehealth: Payer: Self-pay | Admitting: Vascular Surgery

## 2016-05-09 ENCOUNTER — Encounter (HOSPITAL_COMMUNITY): Payer: Self-pay | Admitting: Vascular Surgery

## 2016-05-09 ENCOUNTER — Telehealth: Payer: Self-pay

## 2016-05-09 NOTE — Telephone Encounter (Signed)
-----   Message from Mena Goes, RN sent at 05/08/2016  1:02 PM EDT ----- Regarding: 4-6 weeks w/ Right ABI   ----- Message ----- From: Conrad , MD Sent: 05/08/2016  11:36 AM To: [facility] [address]  Christian Gibson [id number] Jan 11, 1951  PROCEDURE: 1.  left common femoral artery cannulation under ultrasound guidance 2.  Placement of catheter in aorta 3.  Aortogram 4.  Second order arterial selection 5.  Right leg runoff 6.  Angioplasty of right peroneal artery (2 mm x 150 mm) 7.  Conscious sedation for 50 minutes  Follow-up: 4-6 weeks  Orders on follow-up: R ABI

## 2016-05-09 NOTE — Telephone Encounter (Signed)
Spoke to spouse for appt on 5/18 mailed letter as well

## 2016-05-09 NOTE — Telephone Encounter (Signed)
Phone call from pt's wife.  Reported "blood under the skin" in left groin.  Stated there had been a knot yesterday, while pt. was in Recovery, and after the nurse held pressure over the area, it went away.  Stated there was "a little bruising" of left groin when he left the hospital yesterday.  Reported today, the bruising has spread, but is not raised.  Denied any swelling of site.  Stated pt. denied any pain. Stated she needed to know if she should be concerned.  Noted the pt. takes Coumadin.  Advised that the increased bruising is likely related to the amt. of firm pressure applied to the left groin, post-procedure, and with Coumadin in system, the  bruising would be much more pronounced.  Advised to continue to closely monitor site for any increase in swelling, firmness, and pain.  Advised to call office for any concerns. Also encouraged to call office over weekend, if concerns and the answering service will have MD paged.  Verb. Understanding.

## 2016-05-16 ENCOUNTER — Ambulatory Visit (INDEPENDENT_AMBULATORY_CARE_PROVIDER_SITE_OTHER): Payer: Medicare Other | Admitting: Ophthalmology

## 2016-05-16 DIAGNOSIS — H353134 Nonexudative age-related macular degeneration, bilateral, advanced atrophic with subfoveal involvement: Secondary | ICD-10-CM

## 2016-05-16 DIAGNOSIS — E113592 Type 2 diabetes mellitus with proliferative diabetic retinopathy without macular edema, left eye: Secondary | ICD-10-CM | POA: Diagnosis not present

## 2016-05-16 DIAGNOSIS — E113391 Type 2 diabetes mellitus with moderate nonproliferative diabetic retinopathy without macular edema, right eye: Secondary | ICD-10-CM

## 2016-05-16 DIAGNOSIS — I10 Essential (primary) hypertension: Secondary | ICD-10-CM | POA: Diagnosis not present

## 2016-05-16 DIAGNOSIS — D3131 Benign neoplasm of right choroid: Secondary | ICD-10-CM

## 2016-05-16 DIAGNOSIS — E11319 Type 2 diabetes mellitus with unspecified diabetic retinopathy without macular edema: Secondary | ICD-10-CM

## 2016-05-16 DIAGNOSIS — H35033 Hypertensive retinopathy, bilateral: Secondary | ICD-10-CM

## 2016-06-02 ENCOUNTER — Encounter: Payer: Self-pay | Admitting: Vascular Surgery

## 2016-06-02 NOTE — Progress Notes (Signed)
Established Critical Limb Ischemia Patient   History of Present Illness   Christian Gibson is a 66 y.o. ([birth year]) male who presents with chief complaint: "think right foot feels better."  Patient underwent PTA R peroneal artery (05/08/16) for right foot ulcers.  The patient has no rest pain and wounds include: ulcers that appear to be healilng.  The patient notes symptoms have not progressed.  The patient's treatment regimen currently included: maximal medical management.  He notes some increased swelling  The patient's PMH, PSH, SH, and FamHx are unchanged from 05/08/16.  Current Outpatient Prescriptions  Medication Sig Dispense Refill  . acetaminophen (TYLENOL) 325 MG tablet Take 650 mg by mouth every 4 (four) hours as needed for mild pain.     Marland Kitchen BREO ELLIPTA 100-25 MCG/INH AEPB Take 1 puff by mouth daily.     . carvedilol (COREG) 12.5 MG tablet Take 12.5 mg by mouth 2 (two) times daily with a meal.    . clopidogrel (PLAVIX) 75 MG tablet Take 1 tablet (75 mg total) by mouth daily. 30 tablet 0  . Cyanocobalamin (RA VITAMIN B-12 TR) 1000 MCG TBCR Take 1 tablet by mouth daily.     . diclofenac (VOLTAREN) 75 MG EC tablet Take 75 mg by mouth 2 (two) times daily as needed for mild pain.     . furosemide (LASIX) 20 MG tablet Take 20 mg by mouth 2 (two) times daily. Morning and early afternoon    . glimepiride (AMARYL) 1 MG tablet Take 1 mg by mouth daily as needed (Sugar is high in mornings).     Marland Kitchen KOMBIGLYZE XR 2.05-998 MG TB24 Take 1 tablet by mouth 2 (two) times daily. Noon and bedtime    . levothyroxine (SYNTHROID, LEVOTHROID) 25 MCG tablet Take 12.5 mcg by mouth daily before breakfast.     . lisinopril (PRINIVIL,ZESTRIL) 10 MG tablet Take 10 mg by mouth daily.     Marland Kitchen LYRICA 75 MG capsule Take 75 mg by mouth 2 (two) times daily.     . rosuvastatin (CRESTOR) 20 MG tablet Take 20 mg by mouth daily.     Marland Kitchen SPIRIVA HANDIHALER 18 MCG inhalation capsule Place 18 mcg into inhaler and inhale daily.      . Vitamin D, Ergocalciferol, (DRISDOL) 50000 UNITS CAPS capsule Take 50,000 Units by mouth every 7 (seven) days. mondays    . warfarin (COUMADIN) 2 MG tablet Take 2-4 mg by mouth See admin instructions. Takes 4 mg on Monday and Wednesday takes 2 mg all other days, takes at bedtime     No current facility-administered medications for this visit.    Facility-Administered Medications Ordered in Other Visits  Medication Dose Route Frequency Provider Last Rate Last Dose  . ceFAZolin (ANCEF) IVPB 2 g/50 mL premix  2 g Intravenous 30 min Pre-Op [name], MD        No Known Allergies  On ROS today: no drainage, no rest pain   Physical Examination   Vitals:   06/06/16 1122  BP: 128/73  Pulse: (!) 57  Resp: 20  Temp: 97 F (36.1 C)  TempSrc: Oral  SpO2: 95%  Weight: 162 lb 6.4 oz (73.7 kg)  Height: 5\' 4"  (1.626 m)    Body mass index is 27.88 kg/m.  General Alert, O x 3, WD, NAD  Pulmonary Sym exp, good B air movt, CTA B  Cardiac RRR, Nl S1, S2, no Murmurs, No rubs, No S3,S4  Vascular Vessel Right Left  Radial  Palpable Palpable  Brachial Palpable Palpable  Carotid Palpable, No Bruit Palpable, No Bruit  Aorta Not palpable N/A  Femoral Palpable Palpable  Popliteal Not palpable Not palpable  PT Not palpable Not palpable  DP Not palpable Not palpable    Gastrointestinal soft, non-distended, non-tender to palpation, No guarding or rebound, no HSM, no masses, no CVAT B, No palpable prominent aortic pulse,    Musculoskeletal M/S 5/5 throughout  , Extremities without ischemic changes except healing ulcers: all ulcers appear to be healing and contract, 3rd toe ulcer appears to be superficial, Edema present: 2-3+ edema in both lower leg and feet,   Neurologic Pain and light touch intact in extremities except for decreased sensation in B feet, Motor exam as listed above    Non-Invasive Vascular Imaging   ABI (06/06/2016)  R:   ABI: 1.04 ([place]),   PT: mono  DP: mono    Peroneal: mono  TBI:  0.23  L:   ABI: [place] ([place]),   PT: mono  DP: mono  TBI: 0.44   Medical Decision Making   Christian Gibson is a 66 y.o. male who presents with: s/p R peroneal recannulation for critical limb ischemia with right foot ulcers, BLE CVI   Pt appears to be healing with some improvement in sx.    B knee high OTC compression stockings should help with swelling.  I suspect this will be enough to get the patient to heal completely with the improvement in blood flow with recannulation of the peroneal artery and subsequent inline flow to right foot  I discussed in depth with the patient the nature of atherosclerosis, and emphasized the importance of maximal medical management including strict control of blood pressure, blood glucose, and lipid levels, antiplatelet agents, obtaining regular exercise, and cessation of smoking.    The patient is aware that without maximal medical management the underlying atherosclerotic disease process will progress, limiting the benefit of any interventions. The patient is currently on a statin: Crestor.  The patient is currently on an anti-platelet: Plavix.  Will discontinued Plavix due to bleeding risk. Pt is also on Warfarin. Will check on patient's R foot in 1-2 months.  Thank you for allowing [place] to participate in this patient's care.   [name], MD, FACS [facility] Office: [phone] Pager: [phone]

## 2016-06-04 ENCOUNTER — Other Ambulatory Visit: Payer: Self-pay | Admitting: *Deleted

## 2016-06-04 DIAGNOSIS — I739 Peripheral vascular disease, unspecified: Secondary | ICD-10-CM

## 2016-06-06 ENCOUNTER — Ambulatory Visit (INDEPENDENT_AMBULATORY_CARE_PROVIDER_SITE_OTHER): Payer: Medicare Other | Admitting: Vascular Surgery

## 2016-06-06 ENCOUNTER — Ambulatory Visit (HOSPITAL_COMMUNITY)
Admission: RE | Admit: 2016-06-06 | Discharge: 2016-06-06 | Disposition: A | Discharge status: Home / Self Care | Payer: Medicare Other | Source: Ambulatory Visit | Attending: Vascular Surgery | Admitting: Vascular Surgery

## 2016-06-06 ENCOUNTER — Encounter: Payer: Self-pay | Admitting: Vascular Surgery

## 2016-06-06 VITALS — BP 128/73 | HR 57 | Temp 97.0°F | Resp 20 | Ht 64.0 in | Wt 162.4 lb

## 2016-06-06 DIAGNOSIS — I7025 Atherosclerosis of native arteries of other extremities with ulceration: Secondary | ICD-10-CM | POA: Diagnosis not present

## 2016-06-06 DIAGNOSIS — I739 Peripheral vascular disease, unspecified: Secondary | ICD-10-CM | POA: Insufficient documentation

## 2016-06-27 ENCOUNTER — Encounter (HOSPITAL_COMMUNITY): Payer: Medicare Other

## 2016-06-27 ENCOUNTER — Ambulatory Visit: Payer: Medicare Other | Admitting: Vascular Surgery

## 2016-07-03 IMAGING — CT CT HEAD W/O CM
2 series · 16 of 30 positions shown, 20 images · non-contrast
Comparison: None.

CLINICAL DATA: Loss of vision in the right eye.

EXAM:
CT HEAD WITHOUT CONTRAST
TECHNIQUE: Contiguous axial images were obtained from the base of the skull
through the vertex without intravenous contrast.

[Series 201: head w/o, idose (1) · axial · non-contrast · 0.49mm/px · z∈[+51,+181]mm · 13 of 32 slices shown, 17 images]
[im 3/32  brain]
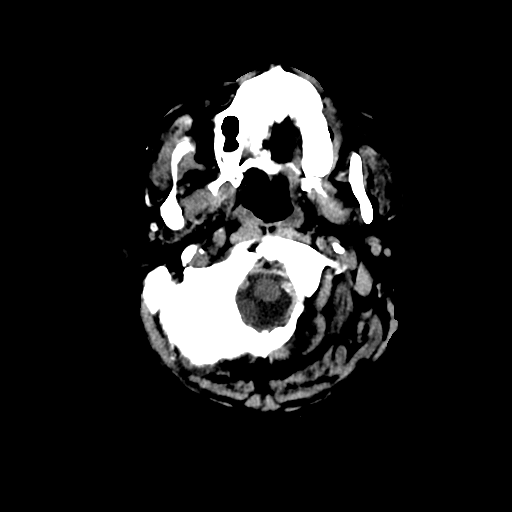
[im 3/32  bone]
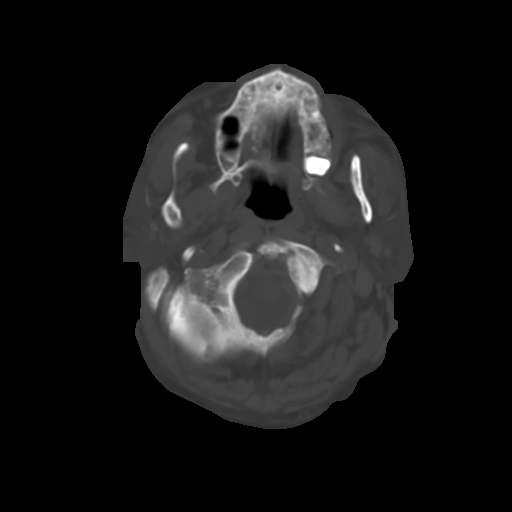
[im 5/32  brain]
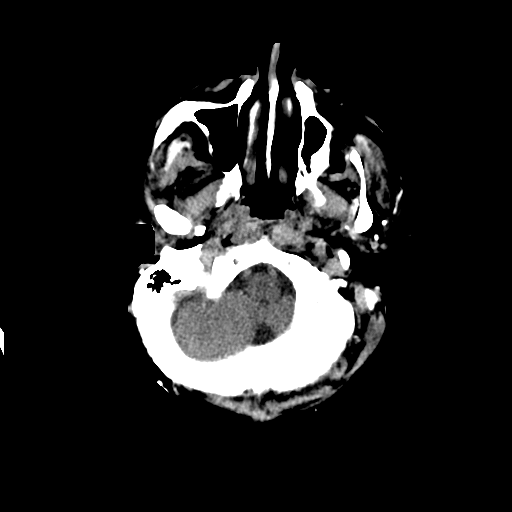
[im 7/32  brain]
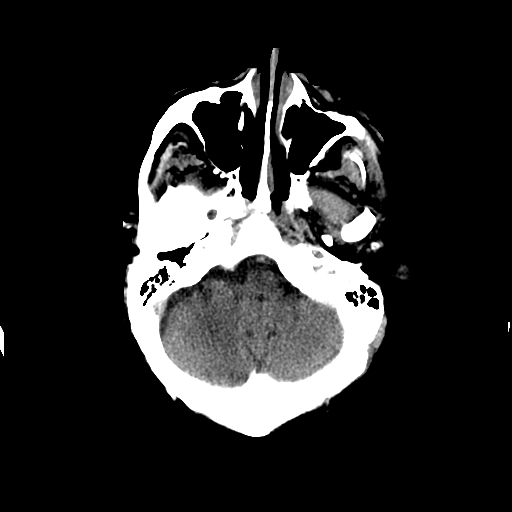
[im 9/32  brain]
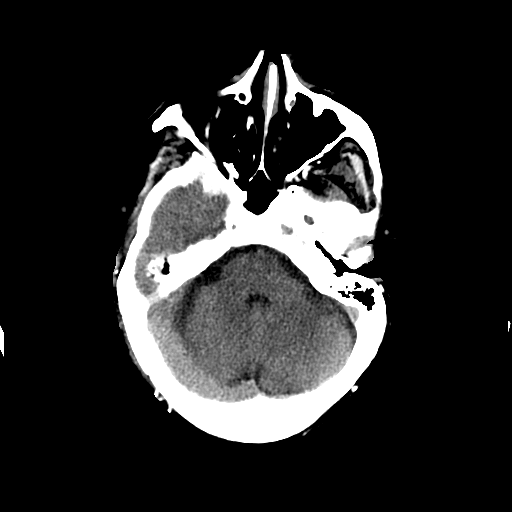
[im 12/32  brain]
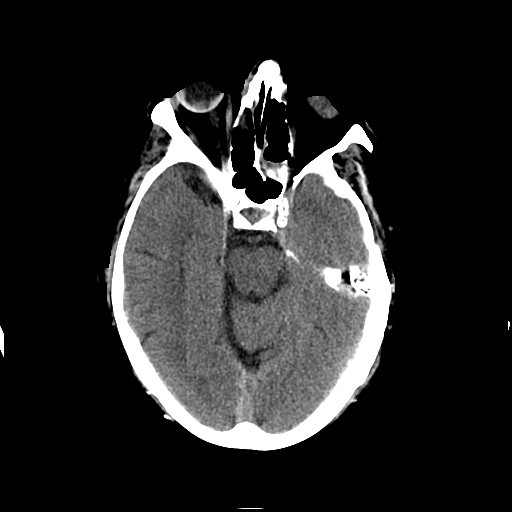
[im 12/32  bone]
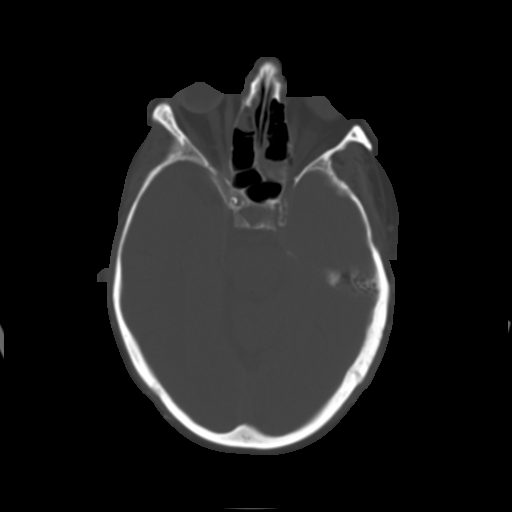
[im 14/32  brain]
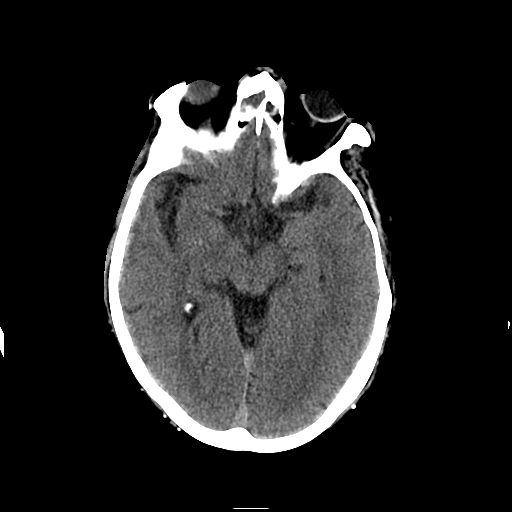
[im 16/32  brain]
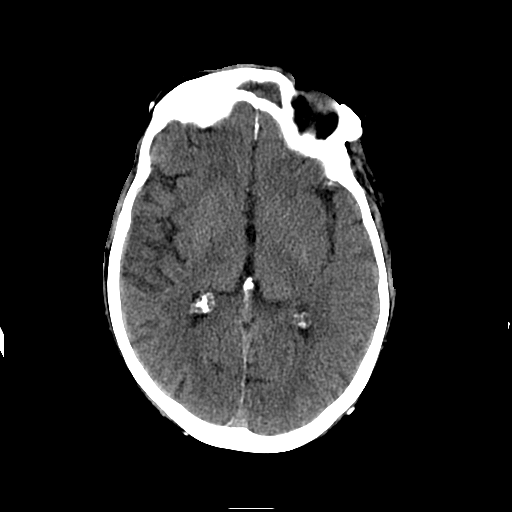
[im 18/32  brain]
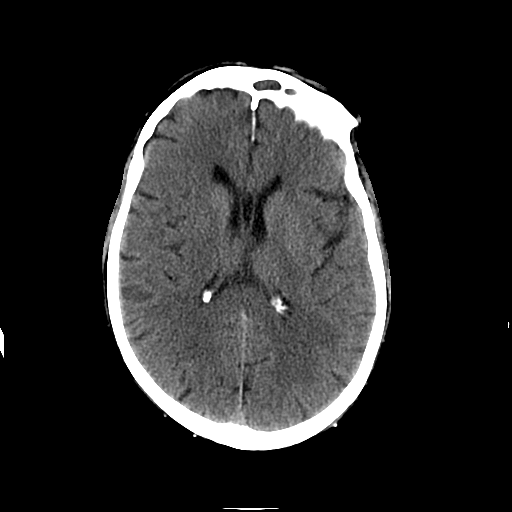
[im 20/32  brain]
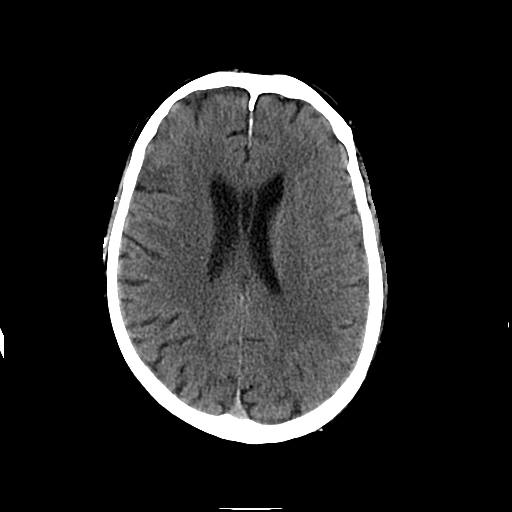
[im 20/32  bone]
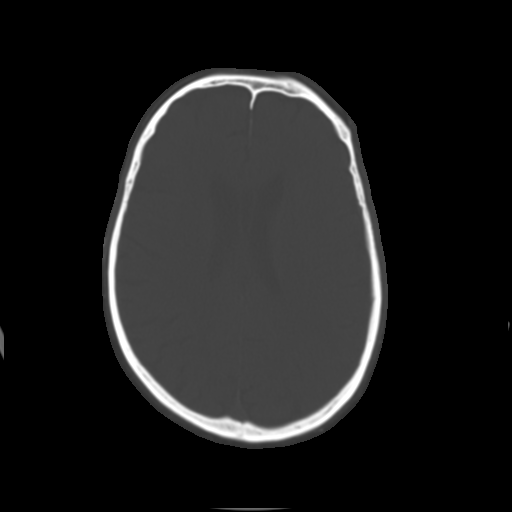
[im 23/32  brain]
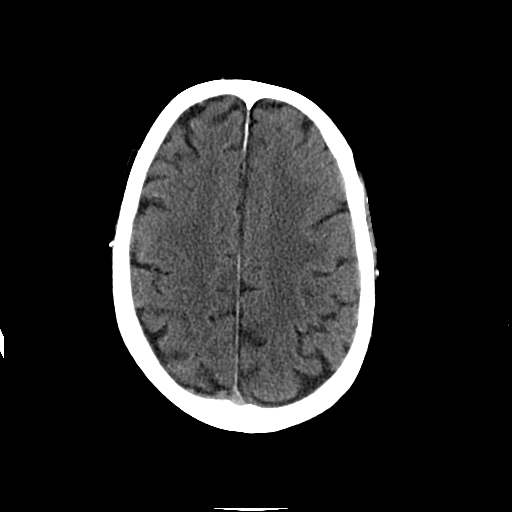
[im 25/32  brain]
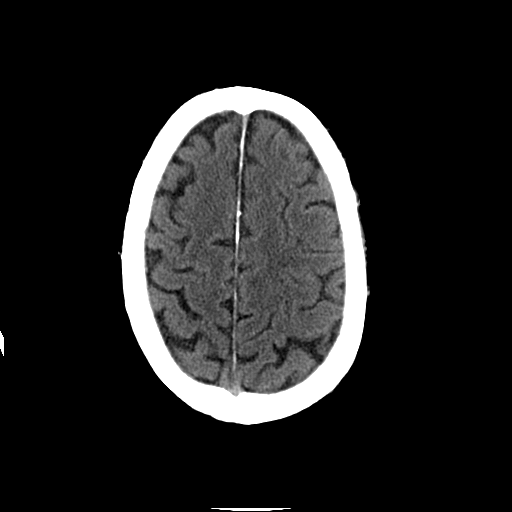
[im 27/32  brain]
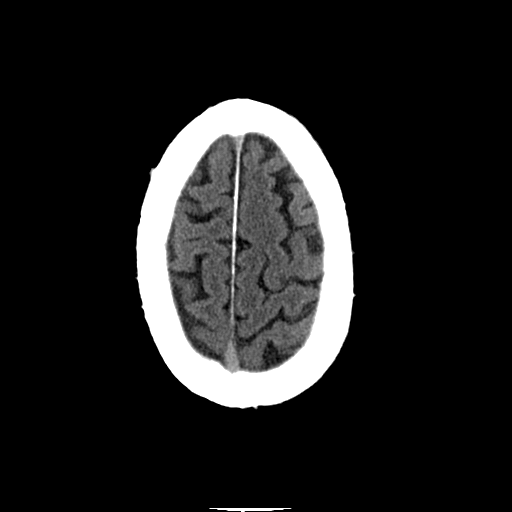
[im 29/32  brain]
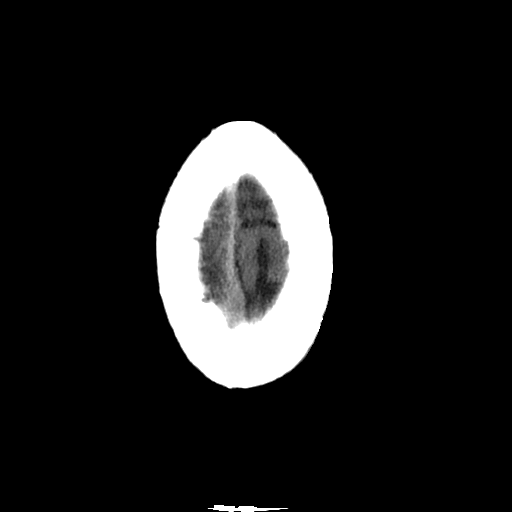
[im 29/32  bone]
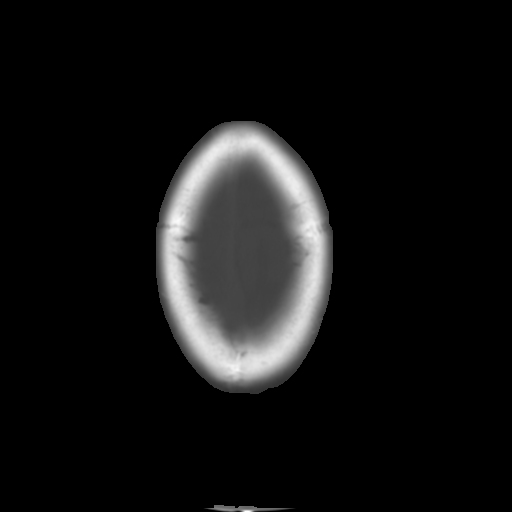

[Series 202: head w/o bone, idose (1) · axial · non-contrast · 0.49mm/px · z∈[+51,+96]mm · 3 of 32 slices shown]
[im 3/32  bone]
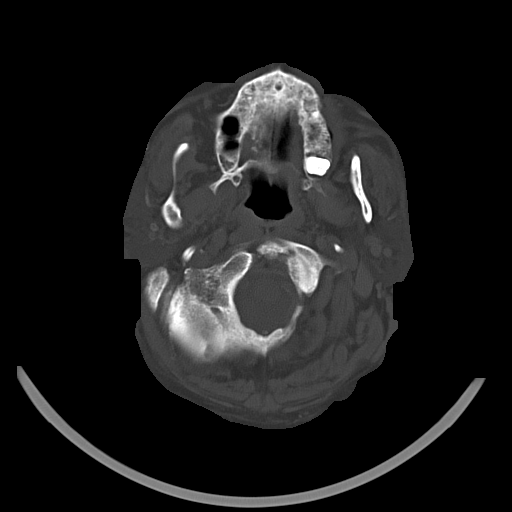
[im 7/32  bone]
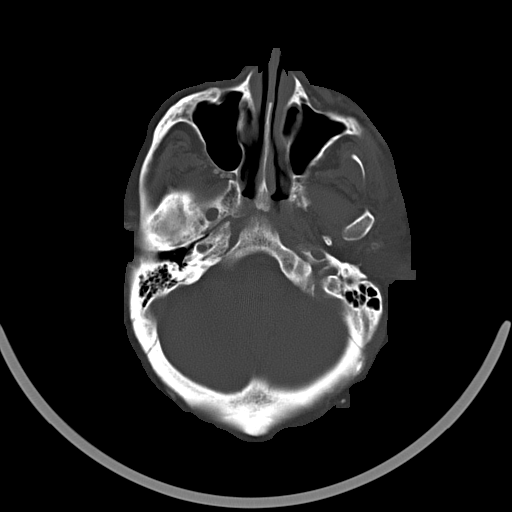
[im 12/32  bone]
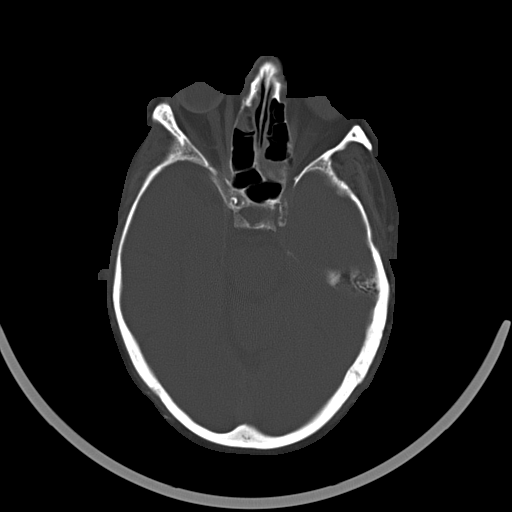

[16 of 30 positions shown; findings below may reference images not displayed]

FINDINGS: Crescentic hyperdensity in the region of the right retina could
represent hemorrhage. No acute intracranial hemorrhage, infarct, or
mass lesion is identified. Cavum vergae incidentally noted. No
midline shift. Moderate pansinusitis. No osseous destruction. No
skull fracture. Retro-orbital fat is clean.
IMPRESSION: No acute intracranial finding.

Crescentic hyperdensity in the region of the right retina which
could represent hemorrhage.

## 2016-08-08 ENCOUNTER — Ambulatory Visit: Payer: Medicare Other | Admitting: Vascular Surgery

## 2016-08-12 ENCOUNTER — Encounter: Payer: Self-pay | Admitting: Vascular Surgery

## 2016-08-25 NOTE — Progress Notes (Signed)
Established Critical Limb Ischemia Patient   History of Present Illness   Billie Intriago is a 66 y.o. ([birth year]) male who presents with chief complaint: "right foot is healed"  Patient underwent PTA R peroneal artery (05/08/16) for right foot ulcers.  The patient has no rest pain and wounds include: ulcers that appear to be healilng.  The patient notes symptoms have not progressed.  The patient's treatment regimen currently included: maximal medical management.  He notes some increased swelling  The patient's PMH, PSH, SH, and FamHx are unchanged from 06/06/16  Current Outpatient Prescriptions  Medication Sig Dispense Refill  . acetaminophen (TYLENOL) 325 MG tablet Take 650 mg by mouth every 4 (four) hours as needed for mild pain.     Marland Kitchen BREO ELLIPTA 100-25 MCG/INH AEPB Take 1 puff by mouth daily.     . carvedilol (COREG) 12.5 MG tablet Take 12.5 mg by mouth 2 (two) times daily with a meal.    . clopidogrel (PLAVIX) 75 MG tablet Take 1 tablet (75 mg total) by mouth daily. 30 tablet 0  . Cyanocobalamin (RA VITAMIN B-12 TR) 1000 MCG TBCR Take 1 tablet by mouth daily.     . diclofenac (VOLTAREN) 75 MG EC tablet Take 75 mg by mouth 2 (two) times daily as needed for mild pain.     . furosemide (LASIX) 20 MG tablet Take 20 mg by mouth 2 (two) times daily. Morning and early afternoon    . glimepiride (AMARYL) 1 MG tablet Take 1 mg by mouth daily as needed (Sugar is high in mornings).     Marland Kitchen KOMBIGLYZE XR 2.05-998 MG TB24 Take 1 tablet by mouth 2 (two) times daily. Noon and bedtime    . levothyroxine (SYNTHROID, LEVOTHROID) 25 MCG tablet Take 12.5 mcg by mouth daily before breakfast.     . lisinopril (PRINIVIL,ZESTRIL) 10 MG tablet Take 10 mg by mouth daily.     Marland Kitchen LYRICA 75 MG capsule Take 75 mg by mouth 2 (two) times daily.     . rosuvastatin (CRESTOR) 20 MG tablet Take 20 mg by mouth daily.     Marland Kitchen SPIRIVA HANDIHALER 18 MCG inhalation capsule Place 18 mcg into inhaler and inhale daily.       . Vitamin D, Ergocalciferol, (DRISDOL) 50000 UNITS CAPS capsule Take 50,000 Units by mouth every 7 (seven) days. mondays    . warfarin (COUMADIN) 2 MG tablet Take 2-4 mg by mouth See admin instructions. Takes 4 mg on Monday and Wednesday takes 2 mg all other days, takes at bedtime     No current facility-administered medications for this visit.    Facility-Administered Medications Ordered in Other Visits  Medication Dose Route Frequency Provider Last Rate Last Dose  . ceFAZolin (ANCEF) IVPB 2 g/50 mL premix  2 g Intravenous 30 min Pre-Op [name], MD        No Known Allergies  On ROS today: no rest pain, no intermittent claudication    Physical Examination   Vitals:   08/29/16 0835  BP: (!) 121/59  Pulse: 67  Resp: 20  Temp: (!) 97 F (36.1 C)  TempSrc: Oral  SpO2: 96%  Weight: 152 lb 12.8 oz (69.3 kg)  Height: 5\' 4"  (1.626 m)   Body mass index is 26.23 kg/m.  General Alert, O x 3, WD, NAD  Pulmonary Sym exp, good B air movt, CTA B  Cardiac RRR, Nl S1, S2, no Murmurs, No rubs, No S3,S4  Vascular Vessel Right Left  Radial Palpable Palpable  Brachial Palpable Palpable  Carotid Palpable, No Bruit Palpable, No Bruit  Aorta Not palpable N/A  Femoral Palpable Palpable  Popliteal Not palpable Not palpable  PT Not palpable Not palpable  DP Not palpable Not palpable    Gastrointestinal soft, non-distended, non-tender to palpation, No guarding or rebound, no HSM, no masses, no CVAT B, No palpable prominent aortic pulse,    Musculoskeletal M/S 5/5 throughout  , Extremities without ischemic changes except healed ulcers, minimal dimpling in skin left, Edema present: 1+ edema in both lower leg and feet,   Neurologic Pain and light touch intact in extremities except for decreased sensation in B feet, Motor exam as listed above    Medical Decision Making   Chrishaun Sasso is a 66 y.o. (09/27/1950) male who presents with: s/p R peroneal recannulation for critical limb  ischemia with right foot ulcers, BLE CVI   Recommended patient continue with OTC compression stockings.  Will recheck BLE ABI and RLE arterial duplex in 3 months.  I discussed in depth with the patient the nature of atherosclerosis, and emphasized the importance of maximal medical management including strict control of blood pressure, blood glucose, and lipid levels, antiplatelet agents, obtaining regular exercise, and cessation of smoking.    The patient is aware that without maximal medical management the underlying atherosclerotic disease process will progress, limiting the benefit of any interventions.  The patient is currently on a statin: Crestor.   Pt is also on Warfarin.  Thank you for allowing [place] to participate in this patient's care.   [name], MD, FACS [facility] Office: [phone] Pager: [phone]

## 2016-08-29 ENCOUNTER — Ambulatory Visit (INDEPENDENT_AMBULATORY_CARE_PROVIDER_SITE_OTHER): Payer: Medicare Other | Admitting: Vascular Surgery

## 2016-08-29 ENCOUNTER — Encounter: Payer: Self-pay | Admitting: Vascular Surgery

## 2016-08-29 VITALS — BP 121/59 | HR 67 | Temp 97.0°F | Resp 20 | Ht 64.0 in | Wt 152.8 lb

## 2016-08-29 DIAGNOSIS — I739 Peripheral vascular disease, unspecified: Secondary | ICD-10-CM | POA: Diagnosis not present

## 2016-08-29 DIAGNOSIS — I7025 Atherosclerosis of native arteries of other extremities with ulceration: Secondary | ICD-10-CM | POA: Diagnosis not present

## 2016-09-05 NOTE — Addendum Note (Signed)
Addended by: Lianne Cure A on: 09/05/2016 12:41 PM   Modules accepted: Orders

## 2016-11-29 NOTE — Progress Notes (Signed)
Established Critical Limb Ischemia Patient   History of Present Illness   Christian Gibson is a 66 y.o. (01-11-1951) male who presents with chief complaint: new area of skin breakdown in R foot.  Patient underwent PTA R peroneal artery (05/08/16) for right foot ulcers.The patient has norest pain.  His R foot wounds healed up initially.  He appears to have develop a new ulcer per his Podiatries.  The patient also notes his feet are swelling, unchanged from few months ago.  The patient notes symptoms have notprogressed. The patient's treatment regimen currently included: maximal medical management and Podiatric wound care.   The patient's PMH, PSH, SH, and FamHx are unchanged from 08/29/16  Current Outpatient Medications  Medication Sig Dispense Refill  . acetaminophen (TYLENOL) 325 MG tablet Take 650 mg by mouth every 4 (four) hours as needed for mild pain.     Marland Kitchen BREO ELLIPTA 100-25 MCG/INH AEPB Take 1 puff by mouth daily.     . carvedilol (COREG) 12.5 MG tablet Take 12.5 mg by mouth 2 (two) times daily with a meal.    . clopidogrel (PLAVIX) 75 MG tablet Take 1 tablet (75 mg total) by mouth daily. (Patient not taking: Reported on 08/29/2016) 30 tablet 0  . Cyanocobalamin (RA VITAMIN B-12 TR) 1000 MCG TBCR Take 1 tablet by mouth daily.     . diclofenac (VOLTAREN) 75 MG EC tablet Take 75 mg by mouth 2 (two) times daily as needed for mild pain.     . furosemide (LASIX) 20 MG tablet Take 20 mg by mouth 2 (two) times daily. Morning and early afternoon    . glimepiride (AMARYL) 1 MG tablet Take 1 mg by mouth daily as needed (Sugar is high in mornings).     Marland Kitchen KOMBIGLYZE XR 2.05-998 MG TB24 Take 1 tablet by mouth 2 (two) times daily. Noon and bedtime    . levothyroxine (SYNTHROID, LEVOTHROID) 25 MCG tablet Take 12.5 mcg by mouth daily before breakfast.     . lisinopril (PRINIVIL,ZESTRIL) 10 MG tablet Take 10 mg by mouth daily.     Marland Kitchen LYRICA 75 MG capsule Take 75 mg by mouth 2 (two) times  daily.     . rosuvastatin (CRESTOR) 20 MG tablet Take 20 mg by mouth daily.     Marland Kitchen SPIRIVA HANDIHALER 18 MCG inhalation capsule Place 18 mcg into inhaler and inhale daily.     . Vitamin D, Ergocalciferol, (DRISDOL) 50000 UNITS CAPS capsule Take 50,000 Units by mouth every 7 (seven) days. mondays    . warfarin (COUMADIN) 2 MG tablet Take 2-4 mg by mouth See admin instructions. Takes 4 mg on Monday and Wednesday takes 2 mg all other days, takes at bedtime     No current facility-administered medications for this visit.    On ROS today: +R>L foot swelling,  No fever or chills   Physical Examination   Vitals:   12/05/16 0948  BP: (!) 120/51  Pulse: 68  Resp: 18  Temp: (!) 96.8 F (36 C)  TempSrc: Oral  SpO2: 98%  Weight: 147 lb (66.7 kg)  Height: 5\' 5"  (1.651 m)   Body mass index is 24.46 kg/m.  General Alert, O x 3, WD, NAD  Pulmonary Sym exp, good B air movt, CTA B  Cardiac RRR, Nl S1, S2, no Murmurs, No rubs, No S3,S4  Vascular Vessel Right Left  Radial Palpable Palpable  Brachial Palpable Palpable  Carotid Palpable, No Bruit Palpable, No Bruit  Aorta Not palpable  N/A  Femoral Palpable Palpable  Popliteal Not palpable Not palpable  PT Not palpable Not palpable  DP Not palpable Not palpable    Gastrointestinal soft, non-distended, non-tender to palpation, No guarding or rebound, no HSM, no masses, no CVAT B, No palpable prominent aortic pulse,   Musculoskeletal M/S 5/5 throughout, R foot with small scab over prior healed ulcer overlying 4th MT, ? Laceration over 5th MT extending to lateral foot,  Edema present: 2+ R foot, 1+ edema in both L foot  Neurologic Pain and light touch intact in extremities except for decreased sensation in B feet, Motor exam as listed above   Non-invasive Vascular Imaging   ABI (12/05/16)  R:   ABI: [place] (1.04),   PT: mono  DP: mono  TBI:  0.18  L:   ABI: [place] ([place]),   PT: bi  DP: mono  TBI: 0.25  RLE Arterial Duplex  (12/05/2016)  Difficult exam due to extensive calcificaiton  CFA: tri  PFA: bi  SFA: bi  Pop: bi  Occluded: PTA and ATA: collaterals visualized  Peroneal: patent, mono, 90 c/s   Medical Decision Making   Christian Gibson is a 66 y.o. (04-Sep-1950) male who presents with: s/p R peroneal recannulation for critical limb ischemia with right foot ulcers, BLE CVI   R peroneal artery appears still patent, so nothing more to offer in revascularization in R foot.  Continue wound care and q3 month ABI and RLE arterial duplex.  I discussed in depth with the patient the nature of atherosclerosis, and emphasized the importance of maximal medical management including strict control of blood pressure, blood glucose, and lipid levels, antiplatelet agents, obtaining regular exercise, and cessation of smoking.   The patient is aware that without maximal medical management the underlying atherosclerotic disease process will progress, limiting the benefit of any interventions.  The patient is currently on a statin: Crestor.  Pt is also on Warfarin.  Thank you for allowing [place] to participate in this patient's care.   [name], MD, FACS [facility] Office: [phone] Pager: [phone]

## 2016-12-05 ENCOUNTER — Ambulatory Visit (INDEPENDENT_AMBULATORY_CARE_PROVIDER_SITE_OTHER)
Admission: RE | Admit: 2016-12-05 | Discharge: 2016-12-05 | Disposition: A | Discharge status: Home / Self Care | Payer: Medicare Other | Source: Ambulatory Visit | Attending: Vascular Surgery | Admitting: Vascular Surgery

## 2016-12-05 ENCOUNTER — Ambulatory Visit (HOSPITAL_COMMUNITY)
Admission: RE | Admit: 2016-12-05 | Discharge: 2016-12-05 | Disposition: A | Discharge status: Home / Self Care | Payer: Medicare Other | Source: Ambulatory Visit | Attending: Vascular Surgery | Admitting: Vascular Surgery

## 2016-12-05 ENCOUNTER — Ambulatory Visit (INDEPENDENT_AMBULATORY_CARE_PROVIDER_SITE_OTHER): Payer: Medicare Other | Admitting: Vascular Surgery

## 2016-12-05 ENCOUNTER — Encounter: Payer: Self-pay | Admitting: Vascular Surgery

## 2016-12-05 VITALS — BP 120/51 | HR 68 | Temp 96.8°F | Resp 18 | Ht 65.0 in | Wt 147.0 lb

## 2016-12-05 DIAGNOSIS — I7025 Atherosclerosis of native arteries of other extremities with ulceration: Secondary | ICD-10-CM

## 2016-12-05 DIAGNOSIS — I739 Peripheral vascular disease, unspecified: Secondary | ICD-10-CM | POA: Diagnosis not present

## 2016-12-05 LAB — VAS US LOWER EXTREMITY ARTERIAL DUPLEX
RATIBDISTSYS: 66 cm/s
RPERPSV: 90 cm/s
RSFDPSV: -33 cm/s
RTIBDISTSYS: 54 cm/s
Right super femoral mid sys PSV: -54 cm/s
Right super femoral prox sys PSV: -48 cm/s

## 2016-12-08 NOTE — Addendum Note (Signed)
Addended by: Lianne Cure A on: 12/08/2016 04:26 PM   Modules accepted: Orders

## 2017-03-11 ENCOUNTER — Encounter (HOSPITAL_COMMUNITY): Payer: Medicare Other

## 2017-03-11 ENCOUNTER — Ambulatory Visit: Payer: Medicare Other | Admitting: Vascular Surgery

## 2017-04-09 NOTE — Progress Notes (Signed)
Established Critical Limb Ischemia Patient   History of Present Illness   Christian Gibson is a 67 y.o. (1950-10-13) male who presents with chief complaint: recent admission for pneumonia.  The patient was admitted recently for pneumonia.  After a week of hospitalization, swelling in his feet improved and his foot wounds improved.  His wound care specialists was pleased with his feet and no new interventions are planned.   Patient underwent PTA R peroneal artery (05/08/16) for right foot ulcers.  The patient denies any rest pain.  The patient notes symptoms have not progressed.  The patient's treatment regimen currently included: maximal medical management and Podiatric wound care.  The patient's PMH, PSH, SH, and FamHx are unchanged 12/05/16  Current Outpatient Medications  Medication Sig Dispense Refill  . acetaminophen (TYLENOL) 325 MG tablet Take 650 mg by mouth every 4 (four) hours as needed for mild pain.     Marland Kitchen BREO ELLIPTA 100-25 MCG/INH AEPB Take 1 puff by mouth daily.     . carvedilol (COREG) 12.5 MG tablet Take 12.5 mg by mouth 2 (two) times daily with a meal.    . clopidogrel (PLAVIX) 75 MG tablet Take 1 tablet (75 mg total) by mouth daily. (Patient not taking: Reported on 08/29/2016) 30 tablet 0  . Cyanocobalamin (RA VITAMIN B-12 TR) 1000 MCG TBCR Take 1 tablet by mouth daily.     . diclofenac (VOLTAREN) 75 MG EC tablet Take 75 mg by mouth 2 (two) times daily as needed for mild pain.     . furosemide (LASIX) 20 MG tablet Take 20 mg by mouth 2 (two) times daily. Morning and early afternoon    . glimepiride (AMARYL) 1 MG tablet Take 1 mg by mouth daily as needed (Sugar is high in mornings).     Marland Kitchen KOMBIGLYZE XR 2.05-998 MG TB24 Take 1 tablet by mouth 2 (two) times daily. Noon and bedtime    . levothyroxine (SYNTHROID, LEVOTHROID) 25 MCG tablet Take 12.5 mcg by mouth daily before breakfast.     . lisinopril (PRINIVIL,ZESTRIL) 10 MG tablet Take 10 mg by mouth daily.     Marland Kitchen LYRICA  75 MG capsule Take 75 mg by mouth 2 (two) times daily.     . rosuvastatin (CRESTOR) 20 MG tablet Take 20 mg by mouth daily.     Marland Kitchen SPIRIVA HANDIHALER 18 MCG inhalation capsule Place 18 mcg into inhaler and inhale daily.     . Vitamin D, Ergocalciferol, (DRISDOL) 50000 UNITS CAPS capsule Take 50,000 Units by mouth every 7 (seven) days. mondays    . warfarin (COUMADIN) 2 MG tablet Take 2-4 mg by mouth See admin instructions. Takes 4 mg on Monday and Wednesday takes 2 mg all other days, takes at bedtime     No current facility-administered medications for this visit.    On ROS today:stable ulcers in feet, improve leg swelling   Physical Examination   Vitals:   04/15/17 1540  BP: 130/73  Pulse: 72  Resp: 16  Temp: (!) 96.7 F (35.9 C)  TempSrc: Oral  SpO2: 97%  Weight: 127 lb (57.6 kg)  Height: 5\' 5"  (1.651 m)   Body mass index is 21.13 kg/m.  General Alert, O x 3, WD, NAD  Pulmonary Sym exp, good B air movt, CTA B  Cardiac RRR, Nl S1, S2, no Murmurs, No rubs, No S3,S4  Vascular Vessel Right Left  Radial Palpable Palpable  Brachial Palpable Palpable  Carotid Palpable, No Bruit Palpable, No Bruit  Aorta Not palpable N/A  Femoral Palpable Palpable  Popliteal Not palpable Not palpable  PT Not palpable Not palpable  DP Not palpable Not palpable    Gastro- intestinal soft, non-distended, non-tender to palpation, No guarding or rebound, no HSM, no masses, no CVAT B, No palpable prominent aortic pulse,    Musculo- skeletal M/S 5/5 throughout, Extremities without ischemic changes except small black eschar (<1 cm) L lateral overlying R 5th MT, Left 3rd distal phalange has some residual eschar, small split on distal phalange L 2nd toe , Pitting edema present: 3+ B feet, No visible varicosities , No Lipodermatosclerosis present  Neurologic Pain and light touch intact in extremities , Motor exam as listed above    Non-invasive Vascular Imaging   ABI (04/15/2017)  R:   ABI: Christian Gibson  ([place]),   PT: mono/bi  DP: mono  TBI:  0.17  L:   ABI: 0.51 ([place]),   PT: mono  DP: mono  TBI: 0.11  RLE Arterial Duplex (04/15/2017)  CFA: tri  PFA: bi  SFA: bi  Pop: bi  Occluded: PTA and ATA: collaterals visualized  Peroneal: patent, mono, 84 c/s   Medical Decision Making   Add Dinapoli is a 67 y.o. (1950/03/25) male who presents with: s/p R peroneal recannulation for critical limb ischemia with right foot ulcers, BLE CVI   Continue wound care and q3 month ABI and RLE arterial duplex.  I discussed in depth with the patient the nature of atherosclerosis, and emphasized the importance of maximal medical management including strict control of blood pressure, blood glucose, and lipid levels, antiplatelet agents, obtaining regular exercise, and cessation of smoking.    The patient is aware that without maximal medical management the underlying atherosclerotic disease process will progress, limiting the benefit of any interventions.  The patient is currently on a statin: Crestor.   Pt is also on Warfarin.  Thank you for allowing [place] to participate in this patient's care   [name], MD, [facility] [place] Office: [phone] Pager: [phone]

## 2017-04-15 ENCOUNTER — Encounter: Payer: Self-pay | Admitting: Vascular Surgery

## 2017-04-15 ENCOUNTER — Ambulatory Visit (INDEPENDENT_AMBULATORY_CARE_PROVIDER_SITE_OTHER): Payer: Medicare Other | Admitting: Vascular Surgery

## 2017-04-15 ENCOUNTER — Ambulatory Visit (HOSPITAL_COMMUNITY)
Admission: RE | Admit: 2017-04-15 | Discharge: 2017-04-15 | Disposition: A | Discharge status: Home / Self Care | Payer: Medicare Other | Source: Ambulatory Visit | Attending: Vascular Surgery | Admitting: Vascular Surgery

## 2017-04-15 ENCOUNTER — Ambulatory Visit (INDEPENDENT_AMBULATORY_CARE_PROVIDER_SITE_OTHER)
Admission: RE | Admit: 2017-04-15 | Discharge: 2017-04-15 | Disposition: A | Discharge status: Home / Self Care | Payer: Medicare Other | Source: Ambulatory Visit | Attending: Vascular Surgery | Admitting: Vascular Surgery

## 2017-04-15 ENCOUNTER — Other Ambulatory Visit: Payer: Self-pay

## 2017-04-15 VITALS — BP 130/73 | HR 72 | Temp 96.7°F | Resp 16 | Ht 65.0 in | Wt 127.0 lb

## 2017-04-15 DIAGNOSIS — I7025 Atherosclerosis of native arteries of other extremities with ulceration: Secondary | ICD-10-CM | POA: Diagnosis present

## 2017-04-23 ENCOUNTER — Other Ambulatory Visit: Payer: Self-pay

## 2017-04-23 DIAGNOSIS — I7025 Atherosclerosis of native arteries of other extremities with ulceration: Secondary | ICD-10-CM

## 2017-04-23 DIAGNOSIS — I739 Peripheral vascular disease, unspecified: Secondary | ICD-10-CM

## 2017-05-22 ENCOUNTER — Ambulatory Visit (INDEPENDENT_AMBULATORY_CARE_PROVIDER_SITE_OTHER): Payer: Medicare Other | Admitting: Ophthalmology

## 2017-05-22 DIAGNOSIS — E11319 Type 2 diabetes mellitus with unspecified diabetic retinopathy without macular edema: Secondary | ICD-10-CM

## 2017-05-22 DIAGNOSIS — I10 Essential (primary) hypertension: Secondary | ICD-10-CM

## 2017-05-22 DIAGNOSIS — D3131 Benign neoplasm of right choroid: Secondary | ICD-10-CM | POA: Diagnosis not present

## 2017-05-22 DIAGNOSIS — E113592 Type 2 diabetes mellitus with proliferative diabetic retinopathy without macular edema, left eye: Secondary | ICD-10-CM

## 2017-05-22 DIAGNOSIS — H35033 Hypertensive retinopathy, bilateral: Secondary | ICD-10-CM | POA: Diagnosis not present

## 2017-05-22 DIAGNOSIS — H353134 Nonexudative age-related macular degeneration, bilateral, advanced atrophic with subfoveal involvement: Secondary | ICD-10-CM

## 2017-05-22 DIAGNOSIS — E113291 Type 2 diabetes mellitus with mild nonproliferative diabetic retinopathy without macular edema, right eye: Secondary | ICD-10-CM | POA: Diagnosis not present

## 2017-07-29 ENCOUNTER — Encounter (HOSPITAL_COMMUNITY): Payer: Medicare Other

## 2017-07-29 ENCOUNTER — Ambulatory Visit: Payer: Medicare Other | Admitting: Vascular Surgery

## 2017-08-11 NOTE — Progress Notes (Signed)
Established Critical Limb Ischemia Patient   History of Present Illness   Christian Gibson is a 67 y.o. (1950/10/21) male who presents with chief complaint: no complaints.  The patient previously underwent R peroneal artery PTA on 05/08/16 for R foot uclers.  The patient has no rest pain and all wounds are healed.  The patient notes symptoms have NOT progressed.  The patient's treatment regimen currently included: maximal medical management.  The patient's PMH, PSH, SH, and FamHx were reviewed on 08/12/17 are unchanged from 04/15/17.  Current Outpatient Medications  Medication Sig Dispense Refill  . acetaminophen (TYLENOL) 325 MG tablet Take 650 mg by mouth every 4 (four) hours as needed for mild pain.     Marland Kitchen BREO ELLIPTA 100-25 MCG/INH AEPB Take 1 puff by mouth daily.     . carvedilol (COREG) 12.5 MG tablet Take 12.5 mg by mouth 2 (two) times daily with a meal.    . clopidogrel (PLAVIX) 75 MG tablet Take 1 tablet (75 mg total) by mouth daily. (Patient not taking: Reported on 08/29/2016) 30 tablet 0  . Cyanocobalamin (RA VITAMIN B-12 TR) 1000 MCG TBCR Take 1 tablet by mouth daily.     . diclofenac (VOLTAREN) 75 MG EC tablet Take 75 mg by mouth 2 (two) times daily as needed for mild pain.     . furosemide (LASIX) 20 MG tablet Take 20 mg by mouth 2 (two) times daily. Morning and early afternoon    . glimepiride (AMARYL) 1 MG tablet Take 1 mg by mouth daily as needed (Sugar is high in mornings).     Marland Kitchen KOMBIGLYZE XR 2.05-998 MG TB24 Take 1 tablet by mouth 2 (two) times daily. Noon and bedtime    . levothyroxine (SYNTHROID, LEVOTHROID) 25 MCG tablet Take 12.5 mcg by mouth daily before breakfast.     . lisinopril (PRINIVIL,ZESTRIL) 10 MG tablet Take 10 mg by mouth daily.     Marland Kitchen LYRICA 75 MG capsule Take 75 mg by mouth 2 (two) times daily.     . magnesium oxide (MAG-OX) 400 MG tablet Take 400 mg by mouth daily.    . ondansetron (ZOFRAN) 4 MG tablet Take 4 mg by mouth every 6 (six) hours as needed for  nausea or vomiting.    . rosuvastatin (CRESTOR) 20 MG tablet Take 20 mg by mouth daily.     . Sacubitril-Valsartan (ENTRESTO PO) Take 25 mg by mouth 2 (two) times daily.    Marland Kitchen SPIRIVA HANDIHALER 18 MCG inhalation capsule Place 18 mcg into inhaler and inhale daily.     Marland Kitchen spironolactone (ALDACTONE) 25 MG tablet Take 25 mg by mouth daily.    . Vitamin D, Ergocalciferol, (DRISDOL) 50000 UNITS CAPS capsule Take 50,000 Units by mouth every 7 (seven) days. mondays    . warfarin (COUMADIN) 2 MG tablet Take 2-4 mg by mouth See admin instructions. Takes 4 mg on Monday and Wednesday takes 2 mg all other days, takes at bedtime     No current facility-administered medications for this visit.     On ROS today: no wounds, no rest pain   Physical Examination   Vitals:   08/12/17 1534 08/12/17 1538  BP: (!) 164/74 (!) 160/77  Pulse: 66   Resp: 18   SpO2: 97%   Weight: 143 lb (64.9 kg)   Height: 5\' 5"  (1.651 m)    Body mass index is 23.8 kg/m.  General Alert, O x 3, WD, NAD  Pulmonary Sym exp, good B air  movt, CTA B  Cardiac RRR, Nl S1, S2, no Murmurs, No rubs, No S3,S4  Vascular Vessel Right Left  Radial Palpable Palpable  Brachial Palpable Palpable  Carotid Palpable, No Bruit Palpable, No Bruit  Aorta Not palpable N/A  Femoral Palpable Palpable  Popliteal Not palpable Not palpable  PT Faintly palpable Faintly palpable  DP Faintly palpable Not palpable    Gastro- intestinal soft, non-distended, non-tender to palpation, No guarding or rebound, no HSM, no masses, no CVAT B, No palpable prominent aortic pulse,    Musculo- skeletal M/S 5/5 throughout  , Extremities without ischemic changes  , No edema present, No visible varicosities , No Lipodermatosclerosis present  Neurologic Pain and light touch intact in extremities , Motor exam as listed above    Non-Invasive Vascular Imaging   ABI (08/12/2017)  R:   ABI: 1.0 (0.85),   PT: bi  DP: bi  TBI:  0.26  L:   ABI: 1.0 (0.51),     PT: bi  DP: mono  TBI: 0.28  RLE arterial duplex (08/12/2017)  Biphasic down to pop  AT and PT monophasic  Peroneal not visualized   Medical Decision Making   [name] is a 67 y.o. male who presents with: s/p R peroneal recann for CLI with R foot ulcer, BLE CVI   Based on the patient's vascular studies and examination, I have offered the patient: q3 BLE ABI and RLE arterial duplex.  I discussed in depth with the patient the nature of atherosclerosis, and emphasized the importance of maximal medical management including strict control of blood pressure, blood glucose, and lipid levels, antiplatelet agents, obtaining regular exercise, and cessation of smoking.    The patient is aware that without maximal medical management the underlying atherosclerotic disease process will progress, limiting the benefit of any interventions.  The patient is currently on a statin: Crestor.   The patient is currently not on an anti-platelet due to bleeding risks related to use of an anticoagulant.   Pt is on Warfarin  Thank you for allowing [place] to participate in this patient's care.   [name], MD, FACS [facility] [place] Office: [phone] Pager: [phone]

## 2017-08-12 ENCOUNTER — Ambulatory Visit (INDEPENDENT_AMBULATORY_CARE_PROVIDER_SITE_OTHER)
Admission: RE | Admit: 2017-08-12 | Discharge: 2017-08-12 | Disposition: A | Discharge status: Home / Self Care | Payer: Medicare Other | Source: Ambulatory Visit | Attending: Vascular Surgery | Admitting: Vascular Surgery

## 2017-08-12 ENCOUNTER — Ambulatory Visit (INDEPENDENT_AMBULATORY_CARE_PROVIDER_SITE_OTHER): Payer: Medicare Other | Admitting: Vascular Surgery

## 2017-08-12 ENCOUNTER — Ambulatory Visit (HOSPITAL_COMMUNITY)
Admission: RE | Admit: 2017-08-12 | Discharge: 2017-08-12 | Disposition: A | Discharge status: Home / Self Care | Payer: Medicare Other | Source: Ambulatory Visit | Attending: Vascular Surgery | Admitting: Vascular Surgery

## 2017-08-12 ENCOUNTER — Encounter: Payer: Self-pay | Admitting: Vascular Surgery

## 2017-08-12 ENCOUNTER — Other Ambulatory Visit: Payer: Self-pay

## 2017-08-12 VITALS — BP 160/77 | HR 66 | Resp 18 | Ht 65.0 in | Wt 143.0 lb

## 2017-08-12 DIAGNOSIS — E1151 Type 2 diabetes mellitus with diabetic peripheral angiopathy without gangrene: Secondary | ICD-10-CM | POA: Diagnosis not present

## 2017-08-12 DIAGNOSIS — E785 Hyperlipidemia, unspecified: Secondary | ICD-10-CM | POA: Diagnosis not present

## 2017-08-12 DIAGNOSIS — I7025 Atherosclerosis of native arteries of other extremities with ulceration: Secondary | ICD-10-CM | POA: Diagnosis present

## 2017-08-12 DIAGNOSIS — I739 Peripheral vascular disease, unspecified: Secondary | ICD-10-CM

## 2017-08-12 DIAGNOSIS — I10 Essential (primary) hypertension: Secondary | ICD-10-CM | POA: Insufficient documentation

## 2017-08-12 DIAGNOSIS — F17200 Nicotine dependence, unspecified, uncomplicated: Secondary | ICD-10-CM | POA: Insufficient documentation

## 2017-08-19 ENCOUNTER — Other Ambulatory Visit: Payer: Self-pay

## 2017-08-19 DIAGNOSIS — I7025 Atherosclerosis of native arteries of other extremities with ulceration: Secondary | ICD-10-CM

## 2017-08-19 DIAGNOSIS — I739 Peripheral vascular disease, unspecified: Secondary | ICD-10-CM

## 2017-10-27 ENCOUNTER — Ambulatory Visit (INDEPENDENT_AMBULATORY_CARE_PROVIDER_SITE_OTHER): Payer: Medicare Other | Admitting: Vascular Surgery

## 2017-10-27 ENCOUNTER — Encounter: Payer: Self-pay | Admitting: Vascular Surgery

## 2017-10-27 ENCOUNTER — Ambulatory Visit (HOSPITAL_COMMUNITY)
Admission: RE | Admit: 2017-10-27 | Discharge: 2017-10-27 | Disposition: A | Discharge status: Home / Self Care | Payer: Medicare Other | Source: Ambulatory Visit | Attending: Vascular Surgery | Admitting: Vascular Surgery

## 2017-10-27 ENCOUNTER — Ambulatory Visit (INDEPENDENT_AMBULATORY_CARE_PROVIDER_SITE_OTHER)
Admission: RE | Admit: 2017-10-27 | Discharge: 2017-10-27 | Disposition: A | Discharge status: Home / Self Care | Payer: Medicare Other | Source: Ambulatory Visit | Attending: Vascular Surgery | Admitting: Vascular Surgery

## 2017-10-27 ENCOUNTER — Other Ambulatory Visit: Payer: Self-pay

## 2017-10-27 VITALS — BP 154/76 | HR 70 | Temp 97.6°F | Resp 16 | Ht 65.0 in | Wt 142.0 lb

## 2017-10-27 DIAGNOSIS — I739 Peripheral vascular disease, unspecified: Secondary | ICD-10-CM

## 2017-10-27 DIAGNOSIS — I7025 Atherosclerosis of native arteries of other extremities with ulceration: Secondary | ICD-10-CM

## 2017-10-27 NOTE — Progress Notes (Signed)
Vitals:   10/27/17 0918  BP: (!) 151/75  Pulse: 68  Resp: 16  Temp: 97.6 F (36.4 C)  TempSrc: Oral  Weight: 142 lb (64.4 kg)  Height: 5\' 5"  (1.651 m)

## 2017-10-27 NOTE — Progress Notes (Signed)
Patient name: Christian Gibson MRN: [id number] DOB: 1950/10/29 Sex: male  REASON FOR VISIT: 12-month follow-up status post remote right peroneal angioplasty  HPI: Christian Gibson is a 67 y.o. male with history of coronary artery disease, diabetes, hypertension, hyperlipidemia, tobacco abuse who presents for interval 44-month follow-up with ABIs.  He was previously a patient of Dr. Lianne Moris and underwent a right peroneal angioplasty on 05/08/2016 for a right foot wound.  That wound has subsequently healed.  On presentation today he has no new tissue loss.  He denies any pain in his feet consistent with rest pain.  His wife states he was recently hospitalized for heart failure exacerbation he does have some leg swelling and missed his fluid pill today.  Limited mobility.  Wife states sits on couch all day and watches tv.  Still smoking.  Past Medical History:  Diagnosis Date  . Arthritis   . COPD (chronic obstructive pulmonary disease) ([place])   . Coronary artery disease   . Diabetes mellitus without complication ([place])   . Heart murmur   . Hypercholesteremia   . Hypertension   . Hypothyroidism   . Myocardial infarction ([place])   . Nerve damage    right hand    Past Surgical History:  Procedure Laterality Date  . ABDOMINAL AORTOGRAM N/A 05/08/2016   Procedure: Abdominal Aortogram;  Surgeon: Conrad Hoke, MD;  Location: [facility];  Service: Cardiovascular;  Laterality: N/A;  . APPENDECTOMY    . CARDIAC CATHETERIZATION    . CARDIAC VALVE REPLACEMENT     aortic  . CAROTID ENDARTERECTOMY     left carotid artery  . CATARACT EXTRACTION     left eye  . CORONARY ARTERY BYPASS GRAFT    . EYE SURGERY    . HERNIA REPAIR    . JOINT REPLACEMENT    . LOWER EXTREMITY ANGIOGRAPHY Right 05/08/2016   Procedure: Lower Extremity Angiography;  Surgeon: Conrad [place], MD;  Location: [facility];  Service: Cardiovascular;  Laterality: Right;  . PERIPHERAL VASCULAR BALLOON ANGIOPLASTY Right 05/08/2016   Procedure: Peripheral Vascular Balloon Angioplasty;  Surgeon: Conrad Belmar, MD;  Location: [facility];  Service: Cardiovascular;  Laterality: Right;  PTA   . PERIPHERAL VASCULAR CATHETERIZATION N/A 02/15/2015   Procedure: Abdominal Aortogram;  Surgeon: Conrad [place], MD;  Location: [facility];  Service: Cardiovascular;  Laterality: N/A;  . TONSILLECTOMY      Family History  Problem Relation Age of Onset  . Stroke Mother   . Cancer Father   . Cancer Other   . Hypertension Other   . Diabetes Other   . Heart disease Brother     SOCIAL HISTORY: Social History   Tobacco Use  . Smoking status: Current Every Day Smoker    Packs/day: 1.00    Years: 48.00    Pack years: 48.00    Types: Cigarettes  . Smokeless tobacco: Never Used  Substance Use Topics  . Alcohol use: Yes    Alcohol/week: 0.0 standard drinks    Comment: "beer occassionally"    No Known Allergies  Current Outpatient Medications  Medication Sig Dispense Refill  . acetaminophen (TYLENOL) 325 MG tablet Take 650 mg by mouth every 4 (four) hours as needed for mild pain.     Marland Kitchen BREO ELLIPTA 100-25 MCG/INH AEPB Take 1 puff by mouth daily.     . carvedilol (COREG) 12.5 MG tablet Take 12.5 mg by mouth 2 (two) times daily with a  meal.    . Cyanocobalamin (RA VITAMIN B-12 TR) 1000 MCG TBCR Take 1 tablet by mouth daily.     . diclofenac (VOLTAREN) 75 MG EC tablet Take 75 mg by mouth 2 (two) times daily as needed for mild pain.     . furosemide (LASIX) 20 MG tablet Take 20 mg by mouth 2 (two) times daily. Morning and early afternoon    . glimepiride (AMARYL) 1 MG tablet Take 1 mg by mouth daily as needed (Sugar is high in mornings).     Marland Kitchen KOMBIGLYZE XR 2.05-998 MG TB24 Take 1 tablet by mouth 2 (two) times daily. Noon and bedtime    . levothyroxine (SYNTHROID, LEVOTHROID) 25 MCG tablet Take 12.5 mcg by mouth daily before breakfast.     . lisinopril (PRINIVIL,ZESTRIL) 10 MG tablet Take 10 mg by mouth daily.     Marland Kitchen  LYRICA 75 MG capsule Take 75 mg by mouth 2 (two) times daily.     . magnesium oxide (MAG-OX) 400 MG tablet Take 400 mg by mouth daily.    . ondansetron (ZOFRAN) 4 MG tablet Take 4 mg by mouth every 6 (six) hours as needed for nausea or vomiting.    . rosuvastatin (CRESTOR) 20 MG tablet Take 20 mg by mouth daily.     . Sacubitril-Valsartan (ENTRESTO PO) Take 25 mg by mouth 2 (two) times daily.    Marland Kitchen SPIRIVA HANDIHALER 18 MCG inhalation capsule Place 18 mcg into inhaler and inhale daily.     Marland Kitchen spironolactone (ALDACTONE) 25 MG tablet Take 25 mg by mouth daily.    . Vitamin D, Ergocalciferol, (DRISDOL) 50000 UNITS CAPS capsule Take 50,000 Units by mouth every 7 (seven) days. mondays    . warfarin (COUMADIN) 2 MG tablet Take 2-4 mg by mouth See admin instructions. Takes 4 mg on Monday and Wednesday takes 2 mg all other days, takes at bedtime    . clopidogrel (PLAVIX) 75 MG tablet Take 1 tablet (75 mg total) by mouth daily. (Patient not taking: Reported on 10/27/2017) 30 tablet 0   No current facility-administered medications for this visit.     REVIEW OF SYSTEMS:  [X]  denotes positive finding, [ ]  denotes negative finding Cardiac  Comments:  Chest pain or chest pressure:    Shortness of breath upon exertion:    Short of breath when lying flat:    Irregular heart rhythm:        Vascular    Pain in calf, thigh, or hip brought on by ambulation:    Pain in feet at night that wakes you up from your sleep:     Blood clot in your veins:    Leg swelling:  x Both legs      Pulmonary    Oxygen at home:    Productive cough:     Wheezing:         Neurologic    Sudden weakness in arms or legs:     Sudden numbness in arms or legs:     Sudden onset of difficulty speaking or slurred speech:    Temporary loss of vision in one eye:     Problems with dizziness:         Gastrointestinal    Blood in stool:     Vomited blood:         Genitourinary    Burning when urinating:     Blood in urine:         Psychiatric    Major  depression:         Hematologic    Bleeding problems:    Problems with blood clotting too easily:        Skin    Rashes or ulcers:        Constitutional    Fever or chills:      PHYSICAL EXAM: Vitals:   10/27/17 0918 10/27/17 0920  BP: (!) 151/75 (!) 154/76  Pulse: 68 70  Resp: 16   Temp: 97.6 F (36.4 C)   TempSrc: Oral   Weight: 64.4 kg   Height: 5\' 5"  (1.651 m)     GENERAL: The patient is a well-nourished male, in no acute distress. The vital signs are documented above. VASCULAR:  2+ palpable radial pulse BUE 2+ palpable femoral pulse bilateral groins No palpable pedal pulses, no LE tissue loss 2+ edema of BLE Monophasic DP/PT signals BLE PULMONARY: There is good air exchange bilaterally without wheezing or rales. ABDOMEN: Soft and non-tender with normal pitched bowel sounds.  MUSCULOSKELETAL: There are no major deformities or cyanosis. NEUROLOGIC: No focal weakness or paresthesias are detected. SKIN: There are no ulcers or rashes noted. PSYCHIATRIC: The patient has a normal affect.  DATA:   I independently reviewed his noninvasive imaging that shows noncompressible ABIs with monophasic waveforms at the ankle.  His TBI's are essentially unchanged over the last 3 months 0.25 on the right and 0.22 on the left.  Assessment/Plan:  67 year old male previously underwent a right peroneal angioplasty on 05/08/2016 by Dr. Bridgett Larsson for right foot wound that has long since healed.  On presentation today he has no new tissue loss and no symptoms of rest pain or rather any foot pain.  His TBI's are essentially unchanged in the setting of noncompressible ABIs.  I see no role for any intervention at this time and will continue surveillance.  His previous right peroneal angioplasty site is widely patent and he has a triphasic runoff in the peroneal artery.  I did discuss with him and his wife that if he has any issues in the future our options may be rather  limited.  He continues to smoke and discussed that this may compromise the long-term success of his peroneal angioplasty but he has no intentions of quitting.  Will see him again in 6 months with ABI's given stability of symptoms over past 6 months.   Marty Heck, MD [facility] Office: [phone] Pager: [place]

## 2018-04-26 ENCOUNTER — Other Ambulatory Visit: Payer: Self-pay

## 2018-04-26 DIAGNOSIS — I739 Peripheral vascular disease, unspecified: Secondary | ICD-10-CM

## 2018-04-26 NOTE — Progress Notes (Signed)
abi

## 2018-05-04 ENCOUNTER — Encounter (HOSPITAL_COMMUNITY): Payer: Medicare Other

## 2018-05-04 ENCOUNTER — Ambulatory Visit: Payer: Medicare Other | Admitting: Vascular Surgery

## 2018-05-24 ENCOUNTER — Other Ambulatory Visit: Payer: Self-pay

## 2018-05-24 ENCOUNTER — Encounter (INDEPENDENT_AMBULATORY_CARE_PROVIDER_SITE_OTHER): Payer: Medicare Other | Admitting: Ophthalmology

## 2018-05-24 DIAGNOSIS — I10 Essential (primary) hypertension: Secondary | ICD-10-CM

## 2018-05-24 DIAGNOSIS — E113391 Type 2 diabetes mellitus with moderate nonproliferative diabetic retinopathy without macular edema, right eye: Secondary | ICD-10-CM

## 2018-05-24 DIAGNOSIS — H353134 Nonexudative age-related macular degeneration, bilateral, advanced atrophic with subfoveal involvement: Secondary | ICD-10-CM

## 2018-05-24 DIAGNOSIS — E11319 Type 2 diabetes mellitus with unspecified diabetic retinopathy without macular edema: Secondary | ICD-10-CM

## 2018-05-24 DIAGNOSIS — E113592 Type 2 diabetes mellitus with proliferative diabetic retinopathy without macular edema, left eye: Secondary | ICD-10-CM

## 2018-05-24 DIAGNOSIS — D3131 Benign neoplasm of right choroid: Secondary | ICD-10-CM | POA: Diagnosis not present

## 2018-05-24 DIAGNOSIS — H35033 Hypertensive retinopathy, bilateral: Secondary | ICD-10-CM | POA: Diagnosis not present

## 2018-08-10 ENCOUNTER — Encounter: Payer: Self-pay | Admitting: Vascular Surgery

## 2018-08-10 ENCOUNTER — Other Ambulatory Visit: Payer: Self-pay

## 2018-08-10 ENCOUNTER — Ambulatory Visit (INDEPENDENT_AMBULATORY_CARE_PROVIDER_SITE_OTHER): Payer: Medicare Other | Admitting: Vascular Surgery

## 2018-08-10 ENCOUNTER — Ambulatory Visit (HOSPITAL_COMMUNITY)
Admission: RE | Admit: 2018-08-10 | Discharge: 2018-08-10 | Disposition: A | Discharge status: Home / Self Care | Payer: Medicare Other | Source: Ambulatory Visit | Attending: Vascular Surgery | Admitting: Vascular Surgery

## 2018-08-10 VITALS — BP 170/70 | HR 47 | Temp 98.0°F | Resp 18 | Ht 65.0 in | Wt 139.8 lb

## 2018-08-10 DIAGNOSIS — I739 Peripheral vascular disease, unspecified: Secondary | ICD-10-CM | POA: Diagnosis not present

## 2018-08-10 NOTE — Progress Notes (Signed)
Patient name: Christian Gibson MRN: [id number] DOB: [birth year] Sex: male  REASON FOR VISIT: 26-month follow-up status post remote right peroneal angioplasty  HPI: Christian Gibson is a 68 y.o. male with history of coronary artery disease, diabetes, hypertension, hyperlipidemia, tobacco abuse who presents for interval 80-month follow-up with ABIs.  He was previously a patient of Dr. Lianne Moris and underwent a right peroneal angioplasty on 05/08/2016 for a right foot wound.  That wound has subsequently healed.    On follow-up today he has no new tissue loss.  Denies any rest pain, claudication, or new tissue loss.  Still smoking.  Doesn't do much walking.  Past Medical History:  Diagnosis Date  . Arthritis   . COPD (chronic obstructive pulmonary disease) ([place])   . Coronary artery disease   . Diabetes mellitus without complication ([place])   . Heart murmur   . Hypercholesteremia   . Hypertension   . Hypothyroidism   . Myocardial infarction ([place])   . Nerve damage    right hand    Past Surgical History:  Procedure Laterality Date  . ABDOMINAL AORTOGRAM N/A 05/08/2016   Procedure: Abdominal Aortogram;  Surgeon: Conrad [place], MD;  Location: [facility];  Service: Cardiovascular;  Laterality: N/A;  . APPENDECTOMY    . CARDIAC CATHETERIZATION    . CARDIAC VALVE REPLACEMENT     aortic  . CAROTID ENDARTERECTOMY     left carotid artery  . CATARACT EXTRACTION     left eye  . CORONARY ARTERY BYPASS GRAFT    . EYE SURGERY    . HERNIA REPAIR    . JOINT REPLACEMENT    . LOWER EXTREMITY ANGIOGRAPHY Right 05/08/2016   Procedure: Lower Extremity Angiography;  Surgeon: Conrad Silo, MD;  Location: [facility];  Service: Cardiovascular;  Laterality: Right;  . PERIPHERAL VASCULAR BALLOON ANGIOPLASTY Right 05/08/2016   Procedure: Peripheral Vascular Balloon Angioplasty;  Surgeon: Conrad Frankfort, MD;  Location: [facility];  Service: Cardiovascular;  Laterality: Right;  PTA   . PERIPHERAL VASCULAR  CATHETERIZATION N/A 02/15/2015   Procedure: Abdominal Aortogram;  Surgeon: Conrad Chester, MD;  Location: [facility];  Service: Cardiovascular;  Laterality: N/A;  . TONSILLECTOMY      Family History  Problem Relation Age of Onset  . Stroke Mother   . Cancer Father   . Cancer Other   . Hypertension Other   . Diabetes Other   . Heart disease Brother     SOCIAL HISTORY: Social History   Tobacco Use  . Smoking status: Current Every Day Smoker    Packs/day: 0.50    Years: 48.00    Pack years: 24.00    Types: Cigarettes  . Smokeless tobacco: Never Used  Substance Use Topics  . Alcohol use: Yes    Alcohol/week: 0.0 standard drinks    Comment: "beer occassionally"    No Known Allergies  Current Outpatient Medications  Medication Sig Dispense Refill  . acetaminophen (TYLENOL) 325 MG tablet Take 650 mg by mouth every 4 (four) hours as needed for mild pain.     Marland Kitchen BREO ELLIPTA 100-25 MCG/INH AEPB Take 1 puff by mouth daily.     . carvedilol (COREG) 12.5 MG tablet Take 12.5 mg by mouth 2 (two) times daily with a meal.    . Cyanocobalamin (RA VITAMIN B-12 TR) 1000 MCG TBCR Take 1 tablet by mouth daily.     . ferrous sulfate 325 (65 FE) MG tablet Take  325 mg by mouth 2 (two) times daily with a meal.    . furosemide (LASIX) 20 MG tablet Take 20 mg by mouth 2 (two) times daily. Morning and early afternoon    . glimepiride (AMARYL) 1 MG tablet Take 1 mg by mouth daily as needed (Sugar is high in mornings).     Marland Kitchen KOMBIGLYZE XR 2.05-998 MG TB24 Take 1 tablet by mouth 2 (two) times daily. Noon and bedtime    . levothyroxine (SYNTHROID, LEVOTHROID) 25 MCG tablet Take 12.5 mcg by mouth daily before breakfast.     . magnesium oxide (MAG-OX) 400 MG tablet Take 400 mg by mouth daily.    . mirtazapine (REMERON) 7.5 MG tablet Take 7.5 mg by mouth at bedtime.    . ondansetron (ZOFRAN) 4 MG tablet Take 4 mg by mouth every 6 (six) hours as needed for nausea or vomiting.    . rosuvastatin  (CRESTOR) 20 MG tablet Take 20 mg by mouth daily.     . Sacubitril-Valsartan (ENTRESTO PO) Take 25 mg by mouth 2 (two) times daily.    . sodium bicarbonate 325 MG tablet Take 325 mg by mouth 2 (two) times daily.    Marland Kitchen SPIRIVA HANDIHALER 18 MCG inhalation capsule Place 18 mcg into inhaler and inhale daily.     Marland Kitchen spironolactone (ALDACTONE) 25 MG tablet Take 25 mg by mouth daily.    . Vitamin D, Ergocalciferol, (DRISDOL) 50000 UNITS CAPS capsule Take 50,000 Units by mouth every 7 (seven) days. mondays    . warfarin (COUMADIN) 2 MG tablet Take 2-4 mg by mouth See admin instructions. Takes 4 mg on Monday and Wednesday takes 2 mg all other days, takes at bedtime    . clopidogrel (PLAVIX) 75 MG tablet Take 1 tablet (75 mg total) by mouth daily. (Patient not taking: Reported on 10/27/2017) 30 tablet 0  . diclofenac (VOLTAREN) 75 MG EC tablet Take 75 mg by mouth 2 (two) times daily as needed for mild pain.     Marland Kitchen lisinopril (PRINIVIL,ZESTRIL) 10 MG tablet Take 10 mg by mouth daily.     Marland Kitchen LYRICA 75 MG capsule Take 75 mg by mouth 2 (two) times daily.      No current facility-administered medications for this visit.     REVIEW OF SYSTEMS:  [X]  denotes positive finding, [ ]  denotes negative finding Cardiac  Comments:  Chest pain or chest pressure:    Shortness of breath upon exertion:    Short of breath when lying flat:    Irregular heart rhythm:        Vascular    Pain in calf, thigh, or hip brought on by ambulation:    Pain in feet at night that wakes you up from your sleep:     Blood clot in your veins:    Leg swelling:         Pulmonary    Oxygen at home:    Productive cough:     Wheezing:         Neurologic    Sudden weakness in arms or legs:     Sudden numbness in arms or legs:     Sudden onset of difficulty speaking or slurred speech:    Temporary loss of vision in one eye:     Problems with dizziness:         Gastrointestinal    Blood in stool:     Vomited blood:          Genitourinary  Burning when urinating:     Blood in urine:        Psychiatric    Major depression:         Hematologic    Bleeding problems:    Problems with blood clotting too easily:        Skin    Rashes or ulcers:        Constitutional    Fever or chills:      PHYSICAL EXAM: Vitals:   08/10/18 1233  BP: (!) 170/70  Pulse: (!) 47  Resp: 18  Temp: 98 F (36.7 C)  TempSrc: Oral  SpO2: 99%  Weight: 139 lb 12.8 oz (63.4 kg)  Height: 5\' 5"  (1.651 m)    GENERAL: The patient is a well-nourished male, in no acute distress. The vital signs are documented above. VASCULAR:  2+ palpable radial pulse BUE 2+ palpable femoral pulse bilateral groins No palpable pedal pulses, no LE tissue loss Monophsic bilatearl DP/PT signals PULMONARY: There is good air exchange bilaterally without wheezing or rales. ABDOMEN: Soft and non-tender with normal pitched bowel sounds.  MUSCULOSKELETAL: There are no major deformities or cyanosis. NEUROLOGIC: No focal weakness or paresthesias are detected. SKIN: There are no ulcers or rashes noted. PSYCHIATRIC: The patient has a normal affect.  DATA:   I independently reviewed his noninvasive imaging that shows noncompressible ABIs with monophasic waveforms at the ankle.    Assessment/Plan:  68 year old male previously underwent a right peroneal angioplasty on 05/08/2016 by Dr. Bridgett Larsson for right foot wound that has long since healed.  On follow-up today he has no new symptoms including no claudication no rest pain and no new tissue loss.  States his legs feel fine.  Last time I saw him he had a fair amount of leg swelling from some heart failure exacerbation that seems to have improved.  Discussed no role for intervention given that he is essentially asymptomatic.  I will have him come back in a year for ongoing surveillance.   Marty Heck, MD [facility] Office: [phone] Pager: [place]

## 2019-01-06 ENCOUNTER — Telehealth: Payer: Self-pay

## 2019-01-06 NOTE — Telephone Encounter (Signed)
Christian Gibson with Dr Ena Dawley office called and said that the patient was needing an appt for a non healing wound on his right foot.   Appt and labs made for pt to be seen.   Christian Gibson Mindi Junker

## 2019-01-11 ENCOUNTER — Other Ambulatory Visit: Payer: Self-pay

## 2019-01-11 DIAGNOSIS — I739 Peripheral vascular disease, unspecified: Secondary | ICD-10-CM

## 2019-01-11 DIAGNOSIS — S81801D Unspecified open wound, right lower leg, subsequent encounter: Secondary | ICD-10-CM

## 2019-01-13 ENCOUNTER — Encounter (HOSPITAL_COMMUNITY): Payer: BLUE CROSS/BLUE SHIELD

## 2019-01-13 ENCOUNTER — Ambulatory Visit: Payer: BLUE CROSS/BLUE SHIELD

## 2019-02-07 ENCOUNTER — Ambulatory Visit (INDEPENDENT_AMBULATORY_CARE_PROVIDER_SITE_OTHER): Payer: Medicare Other | Admitting: Physician Assistant

## 2019-02-07 ENCOUNTER — Other Ambulatory Visit: Payer: Self-pay | Admitting: *Deleted

## 2019-02-07 ENCOUNTER — Ambulatory Visit (INDEPENDENT_AMBULATORY_CARE_PROVIDER_SITE_OTHER)
Admission: RE | Admit: 2019-02-07 | Discharge: 2019-02-07 | Disposition: A | Discharge status: Home / Self Care | Payer: Medicare Other | Source: Ambulatory Visit | Attending: Vascular Surgery | Admitting: Vascular Surgery

## 2019-02-07 ENCOUNTER — Ambulatory Visit (HOSPITAL_COMMUNITY)
Admission: RE | Admit: 2019-02-07 | Discharge: 2019-02-07 | Disposition: A | Discharge status: Home / Self Care | Payer: Medicare Other | Source: Ambulatory Visit | Attending: Vascular Surgery | Admitting: Vascular Surgery

## 2019-02-07 ENCOUNTER — Encounter: Payer: Self-pay | Admitting: *Deleted

## 2019-02-07 ENCOUNTER — Other Ambulatory Visit: Payer: Self-pay

## 2019-02-07 VITALS — BP 140/55 | HR 55 | Temp 97.2°F | Resp 18 | Ht 66.0 in | Wt 135.0 lb

## 2019-02-07 DIAGNOSIS — I739 Peripheral vascular disease, unspecified: Secondary | ICD-10-CM | POA: Insufficient documentation

## 2019-02-07 DIAGNOSIS — S91101A Unspecified open wound of right great toe without damage to nail, initial encounter: Secondary | ICD-10-CM

## 2019-02-07 DIAGNOSIS — S81801D Unspecified open wound, right lower leg, subsequent encounter: Secondary | ICD-10-CM

## 2019-02-07 DIAGNOSIS — I7025 Atherosclerosis of native arteries of other extremities with ulceration: Secondary | ICD-10-CM | POA: Diagnosis not present

## 2019-02-07 DIAGNOSIS — S91103A Unspecified open wound of unspecified great toe without damage to nail, initial encounter: Secondary | ICD-10-CM | POA: Insufficient documentation

## 2019-02-07 NOTE — Progress Notes (Signed)
Coumadin clearance faxed to Dr. Sondra Come.

## 2019-02-07 NOTE — Progress Notes (Signed)
Established Critical Limb Ischemia Patient   History of Present Illness   Christian Gibson is a 69 y.o. (January 31, 1950) male who presents with chief complaint: R GT wound.  His past medical history is significant for CAD, diabetes, hypertension, hyperlipidemia, tobacco use.  He underwent right peroneal artery angioplasty by Dr. Bridgett Larsson on 05/08/2016 for a right foot wound that has since healed.  Patient states couple weeks ago his right great toenail came off in the shower.  He had been seen by podiatrist and wound clinic to have used Betadine paint and a p.o. antibiotic course that has since been completed.  Patient does not CT well however says that his wife noticed some drainage from his nailbed.  He denies any significant pain to the area.  He is on Plavix and Coumadin.    The patient's PMH, PSH, SH, and FamHx were reviewed and are unchanged from prior visit.  Current Outpatient Medications  Medication Sig Dispense Refill  . acetaminophen (TYLENOL) 325 MG tablet Take 650 mg by mouth every 4 (four) hours as needed for mild pain.     Marland Kitchen BREO ELLIPTA 100-25 MCG/INH AEPB Take 1 puff by mouth daily.     . carvedilol (COREG) 12.5 MG tablet Take 12.5 mg by mouth 2 (two) times daily with a meal.    . clopidogrel (PLAVIX) 75 MG tablet Take 1 tablet (75 mg total) by mouth daily. 30 tablet 0  . Cyanocobalamin (RA VITAMIN B-12 TR) 1000 MCG TBCR Take 1 tablet by mouth daily.     . diclofenac (VOLTAREN) 75 MG EC tablet Take 75 mg by mouth 2 (two) times daily as needed for mild pain.     . ferrous sulfate 325 (65 FE) MG tablet Take 325 mg by mouth 2 (two) times daily with a meal.    . furosemide (LASIX) 20 MG tablet Take 20 mg by mouth 2 (two) times daily. Morning and early afternoon    . glimepiride (AMARYL) 1 MG tablet Take 1 mg by mouth daily as needed (Sugar is high in mornings).     Marland Kitchen KOMBIGLYZE XR 2.05-998 MG TB24 Take 1 tablet by mouth 2 (two) times daily. Noon and bedtime    . levothyroxine (SYNTHROID,  LEVOTHROID) 25 MCG tablet Take 12.5 mcg by mouth daily before breakfast.     . lisinopril (PRINIVIL,ZESTRIL) 10 MG tablet Take 10 mg by mouth daily.     Marland Kitchen LYRICA 75 MG capsule Take 75 mg by mouth 2 (two) times daily.     . magnesium oxide (MAG-OX) 400 MG tablet Take 400 mg by mouth daily.    . mirtazapine (REMERON) 7.5 MG tablet Take 7.5 mg by mouth at bedtime.    . ondansetron (ZOFRAN) 4 MG tablet Take 4 mg by mouth every 6 (six) hours as needed for nausea or vomiting.    . rosuvastatin (CRESTOR) 20 MG tablet Take 20 mg by mouth daily.     . Sacubitril-Valsartan (ENTRESTO PO) Take 25 mg by mouth 2 (two) times daily.    . sodium bicarbonate 325 MG tablet Take 325 mg by mouth 2 (two) times daily.    Marland Kitchen SPIRIVA HANDIHALER 18 MCG inhalation capsule Place 18 mcg into inhaler and inhale daily.     Marland Kitchen spironolactone (ALDACTONE) 25 MG tablet Take 25 mg by mouth daily.    . Vitamin D, Ergocalciferol, (DRISDOL) 50000 UNITS CAPS capsule Take 50,000 Units by mouth every 7 (seven) days. mondays    . warfarin (COUMADIN) 2  MG tablet Take 2-4 mg by mouth See admin instructions. Takes 4 mg on Monday and Wednesday takes 2 mg all other days, takes at bedtime     No current facility-administered medications for this visit.    On ROS today: 10 system ROS is negative unless otherwise noted in HPI   Physical Examination   Vitals:   02/07/19 1432  BP: (!) 140/55  Pulse: (!) 55  Resp: 18  Temp: (!) 97.2 F (36.2 C)  TempSrc: Temporal  SpO2: 100%  Weight: 135 lb (61.2 kg)  Height: 5\' 6"  (1.676 m)   Body mass index is 21.79 kg/m.  General Alert, O x 3, WD, NAD  Pulmonary Sym exp, good B air movt, CTA B  Cardiac RRR, Nl S1, S2  Vascular Vessel Right Left  Radial Palpable Palpable  Brachial Palpable Palpable  Femoral Palpable Palpable  PT Not palpable Not palpable  DP Not palpable Not palpable    Gastro- intestinal soft, non-distended, non-tender to palpation,   Musculo- skeletal M/S 5/5  throughout  , Pitting edema and erythema of dorsum of right foot; purulent discharge from middle of great toe nailbed with manipulation of toe  Neurologic Cranial nerves 2-12 intact , Pain and light touch intact in extremities , Motor exam as listed above    Non-Invasive Vascular Imaging   ABI  Tibial arteries noncompressible bilaterally  Arterial duplex right lower extremity No hemodynamically significant stenosis of femoral, superficial femoral or deep femoral however exam limited due to calcified walls; also 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 male who presents with: critical limb ischemia RLE with R GT wound   Right great toe nailbed with purulent drainage  Arterial duplex suggests tibial occlusive disease  Plan will be for aortogram with right lower extremity runoff and possible intervention next available  Risks and benefits were discussed with the patient and his wife and they agreed to proceed with the above procedure  Coumadin will need to be held in preparation for arteriogram   Dagoberto Ligas PA-C [facility] Office: [phone]  Clinic MD: Trula Slade

## 2019-02-08 ENCOUNTER — Other Ambulatory Visit (HOSPITAL_COMMUNITY)
Admission: RE | Admit: 2019-02-08 | Discharge: 2019-02-08 | Disposition: A | Discharge status: Home / Self Care | Payer: Medicare Other | Source: Ambulatory Visit | Attending: Vascular Surgery | Admitting: Vascular Surgery

## 2019-02-08 DIAGNOSIS — Z20822 Contact with and (suspected) exposure to covid-19: Secondary | ICD-10-CM | POA: Diagnosis not present

## 2019-02-08 DIAGNOSIS — Z01812 Encounter for preprocedural laboratory examination: Secondary | ICD-10-CM | POA: Insufficient documentation

## 2019-02-08 LAB — SARS CORONAVIRUS 2 (TAT 6-24 HRS): SARS Coronavirus 2: NEGATIVE

## 2019-02-10 ENCOUNTER — Ambulatory Visit (HOSPITAL_COMMUNITY)
Admission: RE | Admit: 2019-02-10 | Discharge: 2019-02-10 | Disposition: A | Discharge status: Home / Self Care | Payer: Medicare Other | Attending: Vascular Surgery | Admitting: Vascular Surgery

## 2019-02-10 ENCOUNTER — Encounter (HOSPITAL_COMMUNITY)
Admission: RE | Disposition: A | Discharge status: Home / Self Care | Payer: Self-pay | Source: Home / Self Care | Attending: Vascular Surgery

## 2019-02-10 ENCOUNTER — Other Ambulatory Visit: Payer: Self-pay

## 2019-02-10 DIAGNOSIS — L97519 Non-pressure chronic ulcer of other part of right foot with unspecified severity: Secondary | ICD-10-CM | POA: Insufficient documentation

## 2019-02-10 DIAGNOSIS — I70235 Atherosclerosis of native arteries of right leg with ulceration of other part of foot: Secondary | ICD-10-CM | POA: Diagnosis not present

## 2019-02-10 DIAGNOSIS — I2510 Atherosclerotic heart disease of native coronary artery without angina pectoris: Secondary | ICD-10-CM | POA: Diagnosis not present

## 2019-02-10 DIAGNOSIS — Z7901 Long term (current) use of anticoagulants: Secondary | ICD-10-CM | POA: Diagnosis not present

## 2019-02-10 DIAGNOSIS — Z7984 Long term (current) use of oral hypoglycemic drugs: Secondary | ICD-10-CM | POA: Diagnosis not present

## 2019-02-10 DIAGNOSIS — E785 Hyperlipidemia, unspecified: Secondary | ICD-10-CM | POA: Diagnosis not present

## 2019-02-10 DIAGNOSIS — E1151 Type 2 diabetes mellitus with diabetic peripheral angiopathy without gangrene: Secondary | ICD-10-CM | POA: Insufficient documentation

## 2019-02-10 DIAGNOSIS — E11621 Type 2 diabetes mellitus with foot ulcer: Secondary | ICD-10-CM | POA: Diagnosis not present

## 2019-02-10 DIAGNOSIS — Z7951 Long term (current) use of inhaled steroids: Secondary | ICD-10-CM | POA: Insufficient documentation

## 2019-02-10 DIAGNOSIS — Z79890 Hormone replacement therapy (postmenopausal): Secondary | ICD-10-CM | POA: Diagnosis not present

## 2019-02-10 DIAGNOSIS — Z79899 Other long term (current) drug therapy: Secondary | ICD-10-CM | POA: Insufficient documentation

## 2019-02-10 DIAGNOSIS — Z7902 Long term (current) use of antithrombotics/antiplatelets: Secondary | ICD-10-CM | POA: Diagnosis not present

## 2019-02-10 DIAGNOSIS — I10 Essential (primary) hypertension: Secondary | ICD-10-CM | POA: Insufficient documentation

## 2019-02-10 HISTORY — PX: ABDOMINAL AORTOGRAM W/LOWER EXTREMITY: CATH118223

## 2019-02-10 HISTORY — PX: PERIPHERAL VASCULAR BALLOON ANGIOPLASTY: CATH118281

## 2019-02-10 LAB — POCT I-STAT, CHEM 8
BUN: 30 mg/dL — ABNORMAL HIGH (ref 8–23)
Calcium, Ion: 1.14 mmol/L — ABNORMAL LOW (ref 1.15–1.40)
Chloride: 97 mmol/L — ABNORMAL LOW (ref 98–111)
Creatinine, Ser: 1.3 mg/dL — ABNORMAL HIGH (ref 0.61–1.24)
Glucose, Bld: 98 mg/dL (ref 70–99)
HCT: 33 % — ABNORMAL LOW (ref 39.0–52.0)
Hemoglobin: 11.2 g/dL — ABNORMAL LOW (ref 13.0–17.0)
Potassium: 4.9 mmol/L (ref 3.5–5.1)
Sodium: 134 mmol/L — ABNORMAL LOW (ref 135–145)
TCO2: 31 mmol/L (ref 22–32)

## 2019-02-10 LAB — PROTIME-INR
INR: 1.3 — ABNORMAL HIGH (ref 0.8–1.2)
Prothrombin Time: 16 seconds — ABNORMAL HIGH (ref 11.4–15.2)

## 2019-02-10 LAB — POCT ACTIVATED CLOTTING TIME: Activated Clotting Time: 257 seconds

## 2019-02-10 LAB — GLUCOSE, CAPILLARY: Glucose-Capillary: 119 mg/dL — ABNORMAL HIGH (ref 70–99)

## 2019-02-10 SURGERY — ABDOMINAL AORTOGRAM W/LOWER EXTREMITY
Anesthesia: LOCAL | Laterality: Right

## 2019-02-10 MED ORDER — NITROGLYCERIN 1 MG/10 ML FOR IR/CATH LAB
INTRA_ARTERIAL | Status: DC | PRN
  Administered 2019-02-10 (×3): 200 ug via INTRA_ARTERIAL

## 2019-02-10 MED ORDER — SODIUM CHLORIDE 0.9 % IV SOLN: INTRAVENOUS | Status: DC

## 2019-02-10 MED ORDER — IODIXANOL 320 MG/ML IV SOLN
INTRAVENOUS | Status: DC | PRN
  Administered 2019-02-10: 120 mL via INTRA_ARTERIAL

## 2019-02-10 MED ORDER — MIDAZOLAM HCL 2 MG/2ML IJ SOLN
INTRAMUSCULAR | Status: AC
  Filled 2019-02-10: qty 2

## 2019-02-10 MED ORDER — HYDRALAZINE HCL 20 MG/ML IJ SOLN: 5.0000 mg | INTRAMUSCULAR | Status: DC | PRN

## 2019-02-10 MED ORDER — HEPARIN SODIUM (PORCINE) 1000 UNIT/ML IJ SOLN
INTRAMUSCULAR | Status: AC
  Filled 2019-02-10: qty 1

## 2019-02-10 MED ORDER — CLOPIDOGREL BISULFATE 75 MG PO TABS: 75.0000 mg | ORAL_TABLET | Freq: Every day | ORAL | 5 refills | Status: AC

## 2019-02-10 MED ORDER — LIDOCAINE HCL (PF) 1 % IJ SOLN
INTRAMUSCULAR | Status: AC
  Filled 2019-02-10: qty 30

## 2019-02-10 MED ORDER — ACETAMINOPHEN 325 MG PO TABS: 650.0000 mg | ORAL_TABLET | ORAL | Status: DC | PRN

## 2019-02-10 MED ORDER — CLOPIDOGREL BISULFATE 75 MG PO TABS: 75.0000 mg | ORAL_TABLET | Freq: Every day | ORAL | Status: DC

## 2019-02-10 MED ORDER — LIDOCAINE HCL (PF) 1 % IJ SOLN
INTRAMUSCULAR | Status: DC | PRN
  Administered 2019-02-10: 20 mL

## 2019-02-10 MED ORDER — HEPARIN (PORCINE) IN NACL 1000-0.9 UT/500ML-% IV SOLN
INTRAVENOUS | Status: DC | PRN
  Administered 2019-02-10 (×2): 500 mL

## 2019-02-10 MED ORDER — HEPARIN SODIUM (PORCINE) 1000 UNIT/ML IJ SOLN
INTRAMUSCULAR | Status: DC | PRN
  Administered 2019-02-10: 7000 [IU] via INTRAVENOUS

## 2019-02-10 MED ORDER — SODIUM CHLORIDE 0.9% FLUSH: 3.0000 mL | INTRAVENOUS | Status: DC | PRN

## 2019-02-10 MED ORDER — LABETALOL HCL 5 MG/ML IV SOLN: 10.0000 mg | INTRAVENOUS | Status: DC | PRN

## 2019-02-10 MED ORDER — CLOPIDOGREL BISULFATE 75 MG PO TABS: 300.0000 mg | ORAL_TABLET | Freq: Once | ORAL | Status: DC

## 2019-02-10 MED ORDER — FENTANYL CITRATE (PF) 100 MCG/2ML IJ SOLN
INTRAMUSCULAR | Status: AC
  Filled 2019-02-10: qty 2

## 2019-02-10 MED ORDER — NITROGLYCERIN 1 MG/10 ML FOR IR/CATH LAB
INTRA_ARTERIAL | Status: AC
  Filled 2019-02-10: qty 10

## 2019-02-10 MED ORDER — SODIUM CHLORIDE 0.9% FLUSH: 3.0000 mL | Freq: Two times a day (BID) | INTRAVENOUS | Status: DC

## 2019-02-10 MED ORDER — ONDANSETRON HCL 4 MG/2ML IJ SOLN: 4.0000 mg | Freq: Four times a day (QID) | INTRAMUSCULAR | Status: DC | PRN

## 2019-02-10 MED ORDER — SODIUM CHLORIDE 0.9 % IV SOLN: 250.0000 mL | INTRAVENOUS | Status: DC | PRN

## 2019-02-10 MED ORDER — MIDAZOLAM HCL 2 MG/2ML IJ SOLN
INTRAMUSCULAR | Status: DC | PRN
  Administered 2019-02-10 (×2): 1 mg via INTRAVENOUS

## 2019-02-10 MED ORDER — CLOPIDOGREL BISULFATE 300 MG PO TABS
ORAL_TABLET | ORAL | Status: AC
  Filled 2019-02-10: qty 1

## 2019-02-10 MED ORDER — FENTANYL CITRATE (PF) 100 MCG/2ML IJ SOLN
INTRAMUSCULAR | Status: DC | PRN
  Administered 2019-02-10: 50 ug via INTRAVENOUS
  Administered 2019-02-10 (×2): 25 ug via INTRAVENOUS
  Administered 2019-02-10 (×2): 50 ug via INTRAVENOUS

## 2019-02-10 MED ORDER — SODIUM CHLORIDE 0.9 % WEIGHT BASED INFUSION: 1.0000 mL/kg/h | INTRAVENOUS | Status: DC

## 2019-02-10 MED ORDER — HEPARIN (PORCINE) IN NACL 1000-0.9 UT/500ML-% IV SOLN
INTRAVENOUS | Status: AC
  Filled 2019-02-10: qty 1000

## 2019-02-10 MED ORDER — CLOPIDOGREL BISULFATE 300 MG PO TABS
ORAL_TABLET | ORAL | Status: DC | PRN
  Administered 2019-02-10: 300 mg via ORAL

## 2019-02-10 SURGICAL SUPPLY — 27 items
BALLN COYOTE OTW 2.5X150X150 (BALLOONS) ×2
BALLN COYOTE OTW 2X220X150 (BALLOONS) ×2
BALLOON COYOTE OTW 2.5X150X150 (BALLOONS) ×1 IMPLANT
BALLOON COYOTE OTW 2X220X150 (BALLOONS) ×1 IMPLANT
CATH CXI 2.3F 65 ST (CATHETERS) ×2 IMPLANT
CATH CXI 2.6F 65 ST (CATHETERS) ×1
CATH OMNI FLUSH 5F 65CM (CATHETERS) ×2 IMPLANT
CATH SPRT STRG 65X2.6FR ACPT (CATHETERS) ×1 IMPLANT
CLOSURE MYNX CONTROL 6F/7F (Vascular Products) ×2 IMPLANT
DEVICE ONE SNARE 10MM (MISCELLANEOUS) ×2 IMPLANT
KIT ENCORE 26 ADVANTAGE (KITS) ×2 IMPLANT
KIT MICROPUNCTURE NIT STIFF (SHEATH) ×2 IMPLANT
KIT PV (KITS) ×2 IMPLANT
PATCH THROMBIX TOPICAL PLAIN (HEMOSTASIS) ×2 IMPLANT
SHEATH FLEX ANSEL ANG 6F 45CM (SHEATH) ×2 IMPLANT
SHEATH MICROPUNCTURE PEDAL 4FR (SHEATH) ×2 IMPLANT
SHEATH PINNACLE 5F 10CM (SHEATH) ×2 IMPLANT
SHEATH PINNACLE 6F 10CM (SHEATH) ×2 IMPLANT
SHEATH PROBE COVER 6X72 (BAG) ×4 IMPLANT
STOPCOCK MORSE 400PSI 3WAY (MISCELLANEOUS) ×2 IMPLANT
SYR MEDRAD MARK V 150ML (SYRINGE) ×2 IMPLANT
TRANSDUCER W/STOPCOCK (MISCELLANEOUS) ×2 IMPLANT
TRAY PV CATH (CUSTOM PROCEDURE TRAY) ×2 IMPLANT
WIRE BENTSON .035X145CM (WIRE) ×2 IMPLANT
WIRE G V18X300CM (WIRE) ×6 IMPLANT
WIRE ROSEN-J .035X180CM (WIRE) ×2 IMPLANT
WIRE SPARTACORE .014X300CM (WIRE) ×4 IMPLANT

## 2019-02-10 NOTE — Op Note (Signed)
Patient name: Christian Gibson MRN: [id number] DOB: 12/24/1950 Sex: male  02/10/2019 Pre-operative Diagnosis: Critical limb ischemia of the right lower extremity with right great toe wound Post-operative diagnosis:  Same Surgeon:  Marty Heck, MD Procedure Performed: 1.  Ultrasound-guided access of the left common femoral artery 2.  Aortogram with right lower extremity arteriogram including selection of third order branches in the right lower extremity 3.  Ultrasound-guided access of the right anterior tibial artery retrograde at the ankle 4.  Right anterior tibial artery angioplasty (2 mm x 220 mm Coyote and 2.5 mm x 150 mm Coyote) 5.  Mynx closure of the left common femoral artery 6.  109 minutes of monitored moderate conscious sedation  Indications: Patient is a 69 year old male with multiple medical problems well-known to the vascular surgery service.  He previously underwent right peroneal angioplasty by Dr. Bridgett Larsson in 2018 for a foot wound that healed and at the time was documented to have only single-vessel runoff via the peroneal artery.  He recently presented to clinic with a new right great toe wound and severely depressed toe pressures and ABIs.  He presents today for right lower extremity arteriogram and possible intervention after risk benefits were discussed.  Findings:   Aortogram showed no flow-limiting aortoiliac stenosis.  Right lower extremity arteriogram showed a patent common femoral, profunda, SFA, as well as above and below-knee popliteal artery with no flow limiting stenosis.  Patient has severe tibial disease.  He had single-vessel runoff via the peroneal artery that was heavily calcified and quite small.  He did reconstitute an anterior tibial that filled retrograde with flow to the foot via the dorsalis pedis.  Ultimately the anterior tibial was accessed retrograde at the ankle and the anterior tibial lesions as well as chronic total occlusion was crossed  retrograde with an 0.014 wire and 0.014 catheter.  Ultimately a large sheath was then placed in the left groin over the aortic bifurcation and the retrograde wire was snared and with a flossed wire the anterior tibial was angioplastied with initially a 2.0 mm and then 2.5 mm Coyote.  He now has two-vessel runoff via anterior tibial and peroneal artery.   Procedure:  The patient was identified in the holding area and taken to room 8.  The patient was then placed supine on the table and prepped and draped in the usual sterile fashion.  A time out was called.  Ultrasound was used to evaluate the left common femoral artery.  It was patent .  A digital ultrasound image was acquired.  A micropuncture needle was used to access the left common femoral artery under ultrasound guidance.  An 018 wire was advanced without resistance and a micropuncture sheath was placed.  The 018 wire was removed and a benson wire was placed.  The micropuncture sheath was exchanged for a 5 french sheath.  An omniflush catheter was advanced over the wire to the level of L-1.  An abdominal angiogram was obtained.  Next, using the omniflush catheter and a benson wire, the aortic bifurcation was crossed and the catheter was placed into theright external iliac artery and right runoff was obtained.  Pertinent findings are noted above.  Ultimately he had single-vessel peroneal runoff and the artery did not have any focal stenosis although small and calcified.  I thought his best option would be to access his anterior tibial retrograde and try to recanalize chronic total occlusion in the anterior tibial artery.  The right foot was then prepped  and draped.  I used sterile ultrasound evaluated the anterior tibial artery just above the ankle, it was patent, an image was saved.  The anterior tibial artery was accessed with a micro access needle retrograde placed a microwire and then a micro-Cook sheath.  I had pulsatile bleeding in the sheath confirming  I accessed the artery.  Was then given 100 units per kilogram heparin.  ACT was checked to confirm greater than 250.  I then used a V 18 wire with a 018 CXI catheter to come retrograde in the anterior tibial.  Unfortunately had heavy calcific disease throughout the artery and had a lot of trouble getting the catheter to advance retrograde.  I could only get the catheter to advance about midway up the calf.  I ultimately elected to downsize to an 0.014 wire ([place]) with an 0.0014 catheter for a smaller system.  Ultimately I was able to cross anterior tibial retrograde get back in the true lumen at the ostium of the artery.  I pushed the wire on up into the SFA.  At that point time a Constance Holster wire was used upsized to a long 6 [place] Ansell sheath in the left groin placed over the aortic bifurcation.  I then used a snare and snared the retrograde wire that had been placed through the anterior tibial up into the SFA.  We then had a flossed system once I pulled it out of the sheath in the left groin.  I then angioplastied the anterior tibial initially with a 2 mm Coyote throughout its length for 2 minutes at nominal pressure.  I gave 200 mcg nitro.  Shot another angiogram from the sheath above and elected to upsized to 2.5 mm Coyote and again angioplastied throughout the artery given there was some ostial stenosis and some residual calcification stenosis.  After the second angioplasty for 2 minutes at nominal pressure I was very happy with the results.  Another 200 mcg nitro was given.  That point in time the sheath was removed from the anterior tibial at the ankle and manual pressure was held for 5 minutes.  I then removed the wire from the left groin sheath.  We then used a different wire and downsized to a short 6 [place] sheath.  Mynx closure device was deployed in the left common femoral artery.  He had excellent anterior tibial and peroneal signals in the right ankle at completion.  He was loaded on  Plavix.  Plan: Patient was loaded on Plavix and can resume in Coumadin tonight.  Will arrange follow-up in clinic in one month with ABI and right leg arterial duplex.  Instructed him and his wife to place Betadine paint on the right great toe.  Call if toe worse before follow-up.     Marty Heck, MD [facility] Office: [phone]

## 2019-02-10 NOTE — H&P (Signed)
History and Physical Interval Note:  02/10/2019 7:34 AM  Christian Gibson  has presented today for surgery, with the diagnosis of pvd w/ ulcer right foot.  The various methods of treatment have been discussed with the patient and family. After consideration of risks, benefits and other options for treatment, the patient has consented to  Procedure(s): ABDOMINAL AORTOGRAM W/LOWER EXTREMITY (N/A) as a surgical intervention.  The patient's history has been reviewed, patient examined, no change in status, stable for surgery.  I have reviewed the patient's chart and labs.  Questions were answered to the patient's satisfaction.     Established CLI patient.  Previously underwent right peroneal angioplasty with Dr. Bridgett Larsson in 2018.  Recently developed tissue loss in the right great toe.  Plan arteriogram and possible intervention.  Christian Gibson  Established Critical Limb Ischemia Patient  History of Present Illness  Christian Gibson is a 69 y.o. (05-Sep-1950) male who presents with chief complaint: R GT wound. His past medical history is significant for CAD, diabetes, hypertension, hyperlipidemia, tobacco use. He underwent right peroneal artery angioplasty by Dr. Bridgett Larsson on 05/08/2016 for a right foot wound that has since healed. Patient states couple weeks ago his right great toenail came off in the shower. He had been seen by podiatrist and wound clinic to have used Betadine paint and a p.o. antibiotic course that has since been completed. Patient does not CT well however says that his wife noticed some drainage from his nailbed. He denies any significant pain to the area. He is on Plavix and Coumadin.  The patient's PMH, PSH, SH, and FamHx were reviewed and are unchanged from prior visit.        Current Outpatient Medications  Medication Sig Dispense Refill  . acetaminophen (TYLENOL) 325 MG tablet Take 650 mg by mouth every 4 (four) hours as needed for mild pain.     Marland Kitchen BREO ELLIPTA 100-25 MCG/INH AEPB Take 1 puff  by mouth daily.     . carvedilol (COREG) 12.5 MG tablet Take 12.5 mg by mouth 2 (two) times daily with a meal.    . clopidogrel (PLAVIX) 75 MG tablet Take 1 tablet (75 mg total) by mouth daily. 30 tablet 0  . Cyanocobalamin (RA VITAMIN B-12 TR) 1000 MCG TBCR Take 1 tablet by mouth daily.     . diclofenac (VOLTAREN) 75 MG EC tablet Take 75 mg by mouth 2 (two) times daily as needed for mild pain.     . ferrous sulfate 325 (65 FE) MG tablet Take 325 mg by mouth 2 (two) times daily with a meal.    . furosemide (LASIX) 20 MG tablet Take 20 mg by mouth 2 (two) times daily. Morning and early afternoon    . glimepiride (AMARYL) 1 MG tablet Take 1 mg by mouth daily as needed (Sugar is high in mornings).     Marland Kitchen KOMBIGLYZE XR 2.05-998 MG TB24 Take 1 tablet by mouth 2 (two) times daily. Noon and bedtime    . levothyroxine (SYNTHROID, LEVOTHROID) 25 MCG tablet Take 12.5 mcg by mouth daily before breakfast.     . lisinopril (PRINIVIL,ZESTRIL) 10 MG tablet Take 10 mg by mouth daily.     Marland Kitchen LYRICA 75 MG capsule Take 75 mg by mouth 2 (two) times daily.     . magnesium oxide (MAG-OX) 400 MG tablet Take 400 mg by mouth daily.    . mirtazapine (REMERON) 7.5 MG tablet Take 7.5 mg by mouth at bedtime.    . ondansetron (  ZOFRAN) 4 MG tablet Take 4 mg by mouth every 6 (six) hours as needed for nausea or vomiting.    . rosuvastatin (CRESTOR) 20 MG tablet Take 20 mg by mouth daily.     . Sacubitril-Valsartan (ENTRESTO PO) Take 25 mg by mouth 2 (two) times daily.    . sodium bicarbonate 325 MG tablet Take 325 mg by mouth 2 (two) times daily.    Marland Kitchen SPIRIVA HANDIHALER 18 MCG inhalation capsule Place 18 mcg into inhaler and inhale daily.     Marland Kitchen spironolactone (ALDACTONE) 25 MG tablet Take 25 mg by mouth daily.    . Vitamin D, Ergocalciferol, (DRISDOL) 50000 UNITS CAPS capsule Take 50,000 Units by mouth every 7 (seven) days. mondays    . warfarin (COUMADIN) 2 MG tablet Take 2-4 mg by mouth See admin instructions. Takes 4 mg on  Monday and Wednesday takes 2 mg all other days, takes at bedtime     No current facility-administered medications for this visit.   On ROS today: 10 system ROS is negative unless otherwise noted in HPI  Physical Examination      Vitals:   02/07/19 1432  BP: (!) 140/55  Pulse: (!) 55  Resp: 18  Temp: (!) 97.2 F (36.2 C)  TempSrc: Temporal  SpO2: 100%  Weight: 135 lb (61.2 kg)  Height: 5\' 6"  (1.676 m)   Body mass index is 21.79 kg/m.  General Alert, O x 3, WD, NAD  Pulmonary Sym exp, good B air movt, CTA B  Cardiac RRR, Nl S1, S2  Vascular Vessel Right Left    Radial Palpable Palpable    Brachial Palpable Palpable    Femoral Palpable Palpable    PT Not palpable Not palpable    DP Not palpable Not palpable   Gastro-  intestinal soft, non-distended, non-tender to palpation,   Musculo-  skeletal M/S 5/5 throughout , Pitting edema and erythema of dorsum of right foot; purulent discharge from middle of great toe nailbed with manipulation of toe  Neurologic Cranial nerves 2-12 intact , Pain and light touch intact in extremities , Motor exam as listed above   Non-Invasive Vascular Imaging  ABI  Tibial arteries noncompressible bilaterally  Arterial duplex right lower extremity  No hemodynamically significant stenosis of femoral, superficial femoral or deep femoral however exam limited due to calcified walls; 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 male who presents with: critical limb ischemia RLE with R GT wound  Right great toe nailbed with purulent drainage  Arterial duplex suggests tibial occlusive disease  Plan will be for aortogram with right lower extremity runoff and possible intervention next available  Risks and benefits were discussed with the patient and his wife and they agreed to proceed with the above procedure  Coumadin will need to be held in preparation for arteriogram Christian Ligas PA-C   [facility]  Office: [phone]  Clinic MD: Christian Gibson

## 2019-02-10 NOTE — Discharge Instructions (Signed)
   [facility]  Discharge Instructions  Lower Extremity Angiogram; Angioplasty/Stenting  Please refer to the following instructions for your post-procedure care. Your surgeon or physician assistant will discuss any changes with you.  Activity  Avoid lifting more than 8 pounds (1 gallons of milk) for 72 hours (3 days) after your procedure. You may walk as much as you can tolerate. It's OK to drive after 72 hours.  Bathing/Showering  You may shower the day after your procedure. If you have a bandage, you may remove it at 24- 48 hours. Clean your incision site with mild soap and water. Pat the area dry with a clean towel.  Diet  Drink plenty of fluids over the next 3 days.  Resume your pre-procedure diet. There are no special food restrictions following this procedure. All patients with peripheral vascular disease should follow a low fat/low cholesterol diet. In order to heal from your surgery, it is CRITICAL to get adequate nutrition. Your body requires vitamins, minerals, and protein. Vegetables are the best source of vitamins and minerals. Vegetables also provide the perfect balance of protein. Processed food has little nutritional value, so try to avoid this.  Medications  Resume taking all of your medications unless your doctor tells you not to. If your incision is causing pain, you may take over-the-counter pain relievers such as acetaminophen (Tylenol)  Follow Up  Follow up will be arranged at the time of your procedure. You may have an office visit scheduled or may be scheduled for surgery. Ask your surgeon if you have any questions.  Please call us immediately for any of the following conditions: .Severe or worsening pain your legs or feet at rest or with walking. .Increased pain, redness, drainage at your groin puncture site. .Fever of 101 degrees or higher. .If you have any mild or slow bleeding from your puncture site: lie down, apply firm  constant pressure over the area with a piece of gauze or a clean wash cloth for 30 minutes- no peeking!, call 911 right away if you are still bleeding after 30 minutes, or if the bleeding is heavy and unmanageable.  Reduce your risk factors of vascular disease:  Stop smoking. If you would like help call [facility] at 1-800-QUIT-NOW 912-758-5014) or [place] at [phone]. Manage your cholesterol Maintain a desired weight Control your diabetes Keep your blood pressure down  If you have any questions, please call the office at [phone]

## 2019-03-15 ENCOUNTER — Other Ambulatory Visit: Payer: Self-pay

## 2019-03-15 ENCOUNTER — Ambulatory Visit (INDEPENDENT_AMBULATORY_CARE_PROVIDER_SITE_OTHER)
Admission: RE | Admit: 2019-03-15 | Discharge: 2019-03-15 | Disposition: A | Discharge status: Home / Self Care | Payer: Medicare Other | Source: Ambulatory Visit | Attending: Surgery | Admitting: Surgery

## 2019-03-15 ENCOUNTER — Ambulatory Visit (INDEPENDENT_AMBULATORY_CARE_PROVIDER_SITE_OTHER): Payer: Medicare Other | Admitting: Vascular Surgery

## 2019-03-15 ENCOUNTER — Encounter: Payer: Self-pay | Admitting: Vascular Surgery

## 2019-03-15 ENCOUNTER — Ambulatory Visit (HOSPITAL_COMMUNITY)
Admission: RE | Admit: 2019-03-15 | Discharge: 2019-03-15 | Disposition: A | Discharge status: Home / Self Care | Payer: Medicare Other | Source: Ambulatory Visit | Attending: Surgery | Admitting: Surgery

## 2019-03-15 VITALS — BP 121/64 | HR 67 | Temp 97.3°F | Resp 18 | Ht 66.0 in | Wt 138.0 lb

## 2019-03-15 DIAGNOSIS — I739 Peripheral vascular disease, unspecified: Secondary | ICD-10-CM

## 2019-03-15 NOTE — Progress Notes (Signed)
Patient name: Christian Gibson MRN: [id number] DOB: [birth year] Sex: male  REASON FOR VISIT: Follow-up after right lower extremity angioplasty for CLI with tissue loss  HPI: Christian Gibson is a 69 y.o. male who presents for follow-up after right anterior tibial angioplasty including retrograde tibial access on 02/10/2019.  This was for right great toe wound in the setting of severe tibial disease.  Ultimately on follow-up today states that the toe wound has now completely healed.  His wife seems very satisfied with progress on the phone.  He has no other complaints or issues with the left foot at this time.  Past Medical History:  Diagnosis Date  . Arthritis   . COPD (chronic obstructive pulmonary disease) ([place])   . Coronary artery disease   . Diabetes mellitus without complication ([place])   . Heart murmur   . Hypercholesteremia   . Hypertension   . Hypothyroidism   . Myocardial infarction ([place])   . Nerve damage    right hand    Past Surgical History:  Procedure Laterality Date  . ABDOMINAL AORTOGRAM N/A 05/08/2016   Procedure: Abdominal Aortogram;  Surgeon: Conrad Citronelle, MD;  Location: [facility];  Service: Cardiovascular;  Laterality: N/A;  . ABDOMINAL AORTOGRAM W/LOWER EXTREMITY Right 02/10/2019   Procedure: ABDOMINAL AORTOGRAM W/LOWER EXTREMITY;  Surgeon: Marty Heck, MD;  Location: [facility];  Service: Cardiovascular;  Laterality: Right;  . APPENDECTOMY    . CARDIAC CATHETERIZATION    . CARDIAC VALVE REPLACEMENT     aortic  . CAROTID ENDARTERECTOMY     left carotid artery  . CATARACT EXTRACTION     left eye  . CORONARY ARTERY BYPASS GRAFT    . EYE SURGERY    . HERNIA REPAIR    . JOINT REPLACEMENT    . LOWER EXTREMITY ANGIOGRAPHY Right 05/08/2016   Procedure: Lower Extremity Angiography;  Surgeon: Conrad [place], MD;  Location: [facility];  Service: Cardiovascular;  Laterality: Right;  . PERIPHERAL VASCULAR BALLOON ANGIOPLASTY Right 05/08/2016   Procedure: Peripheral Vascular Balloon Angioplasty;  Surgeon: Conrad Adwolf, MD;  Location: [facility];  Service: Cardiovascular;  Laterality: Right;  PTA   . PERIPHERAL VASCULAR BALLOON ANGIOPLASTY Right 02/10/2019   Procedure: PERIPHERAL VASCULAR BALLOON ANGIOPLASTY;  Surgeon: Marty Heck, MD;  Location: [facility];  Service: Cardiovascular;  Laterality: Right;  right AT  . PERIPHERAL VASCULAR CATHETERIZATION N/A 02/15/2015   Procedure: Abdominal Aortogram;  Surgeon: Conrad McMinn, MD;  Location: [facility];  Service: Cardiovascular;  Laterality: N/A;  . TONSILLECTOMY      Family History  Problem Relation Age of Onset  . Stroke Mother   . Cancer Father   . Cancer Other   . Hypertension Other   . Diabetes Other   . Heart disease Brother     SOCIAL HISTORY: Social History   Tobacco Use  . Smoking status: Current Every Day Smoker    Packs/day: 0.50    Years: 48.00    Pack years: 24.00    Types: Cigarettes  . Smokeless tobacco: Never Used  Substance Use Topics  . Alcohol use: Yes    Alcohol/week: 0.0 standard drinks    Comment: "beer occassionally"    Allergies  Allergen Reactions  . 5-Alpha Reductase Inhibitors   . Adhesive [Tape]     Tears skin, paper tape is ok  . Lisinopril Cough    Current Outpatient Medications  Medication Sig Dispense  Refill  . acetaminophen (TYLENOL) 500 MG tablet Take 500 mg by mouth every 6 (six) hours as needed for moderate pain.    Marland Kitchen BREO ELLIPTA 100-25 MCG/INH AEPB Take 1 puff by mouth daily.     . carvedilol (COREG) 12.5 MG tablet Take 12.5 mg by mouth 2 (two) times daily with a meal.    . clopidogrel (PLAVIX) 75 MG tablet Take 1 tablet (75 mg total) by mouth daily. 30 tablet 5  . Cyanocobalamin (RA VITAMIN B-12 TR) 1000 MCG TBCR Take 1,000 mcg by mouth daily.     Marland Kitchen docusate sodium (COLACE) 100 MG capsule Take 100 mg by mouth daily.    . ferrous sulfate 325 (65 FE) MG tablet Take 325 mg by mouth 2 (two) times  daily with a meal.    . furosemide (LASIX) 40 MG tablet Take 40 mg by mouth daily.    Marland Kitchen glimepiride (AMARYL) 1 MG tablet Take 1 mg by mouth daily as needed (Sugar is high in mornings).     . IODINE EX Apply 1 application topically daily as needed (sores).    Marland Kitchen KOMBIGLYZE XR 2.05-998 MG TB24 Take 1 tablet by mouth 2 (two) times daily. Noon and bedtime    . levothyroxine (SYNTHROID, LEVOTHROID) 25 MCG tablet Take 12.5 mcg by mouth daily before breakfast.     . magnesium oxide (MAG-OX) 400 MG tablet Take 400 mg by mouth daily.    . mirtazapine (REMERON) 7.5 MG tablet Take 7.5 mg by mouth at bedtime.    . naproxen sodium (ALEVE) 220 MG tablet Take 220 mg by mouth daily as needed (pain).    . ondansetron (ZOFRAN) 4 MG tablet Take 4 mg by mouth every 6 (six) hours as needed for nausea or vomiting.    . rosuvastatin (CRESTOR) 20 MG tablet Take 20 mg by mouth daily.     . sacubitril-valsartan (ENTRESTO) 24-26 MG Take 1 tablet by mouth 2 (two) times daily.    Marland Kitchen SPIRIVA HANDIHALER 18 MCG inhalation capsule Place 18 mcg into inhaler and inhale daily.     Marland Kitchen spironolactone (ALDACTONE) 25 MG tablet Take 25 mg by mouth daily.    Marland Kitchen thiamine 100 MG tablet Take 100 mg by mouth daily.    . Vitamin D, Ergocalciferol, (DRISDOL) 50000 UNITS CAPS capsule Take 50,000 Units by mouth every Monday.     . warfarin (COUMADIN) 2 MG tablet Take 2-4 mg by mouth See admin instructions. Takes 4 mg on Monday and Wednesday takes 2 mg all other days, takes at bedtime     No current facility-administered medications for this visit.    REVIEW OF SYSTEMS:  [X]  denotes positive finding, [ ]  denotes negative finding Cardiac  Comments:  Chest pain or chest pressure:    Shortness of breath upon exertion:    Short of breath when lying flat:    Irregular heart rhythm:        Vascular    Pain in calf, thigh, or hip brought on by ambulation:    Pain in feet at night that wakes you up from your sleep:     Blood clot in your veins:     Leg swelling:         Pulmonary    Oxygen at home:    Productive cough:     Wheezing:         Neurologic    Sudden weakness in arms or legs:     Sudden numbness in arms or  legs:     Sudden onset of difficulty speaking or slurred speech:    Temporary loss of vision in one eye:     Problems with dizziness:         Gastrointestinal    Blood in stool:     Vomited blood:         Genitourinary    Burning when urinating:     Blood in urine:        Psychiatric    Major depression:         Hematologic    Bleeding problems:    Problems with blood clotting too easily:        Skin    Rashes or ulcers:        Constitutional    Fever or chills:      PHYSICAL EXAM: Vitals:   03/15/19 1536  BP: 121/64  Pulse: 67  Resp: 18  Temp: (!) 97.3 F (36.3 C)  TempSrc: Temporal  SpO2: 98%  Weight: 138 lb (62.6 kg)  Height: 5\' 6"  (1.676 m)    GENERAL: The patient is a well-nourished male, in no acute distress. The vital signs are documented above. CARDIAC: There is a regular rate and rhythm.  VASCULAR:  Brisk right dorsalis pedis signal Right great toe wound healed PULMONARY: There is good air exchange bilaterally without wheezing or rales. ABDOMEN: Soft and non-tender with normal pitched bowel sounds.  MUSCULOSKELETAL: There are no major deformities or cyanosis. NEUROLOGIC: No focal weakness or paresthesias are detected.   DATA:   I did review noninvasive imaging and his right AT angioplasty appears patent.  Assessment/Plan:  69 year old male that underwent retrograde right AT access for angioplasty of chronic total occlusion of the AT in the setting of critical limb ischemia with tissue loss.  His right great toe wound is now healed.  Very brisk right Dp signal on exam.  Looks patent on arterial duplex.  Very pleased with his progress.  Discussed follow-up in 3 months with set of ABIs and arterial duplex.   Marty Heck, MD [facility]  [place] Office: [phone]

## 2019-03-16 ENCOUNTER — Other Ambulatory Visit: Payer: Self-pay | Admitting: *Deleted

## 2019-03-16 DIAGNOSIS — I739 Peripheral vascular disease, unspecified: Secondary | ICD-10-CM

## 2019-03-25 ENCOUNTER — Telehealth: Payer: Self-pay

## 2019-03-25 NOTE — Telephone Encounter (Signed)
Pt's wife called asking when pt can d/c Plavix. Pt had a f/u with Dr. Carlis Abbott last week and forgot to ask this. I have messaged Dr. Carlis Abbott. Per pt's wife his INR this am was 5.0. Since pt is also on Warfarin prescribed by another physician, I advised she call that physician and let them know. She has done this and is waiting to hear back.

## 2019-03-25 NOTE — Telephone Encounter (Signed)
Spoke to pt's wife. Per Dr. Carlis Abbott, I explained pt must stay on Plavix. Wife stated that pt is unable to take 81 mg ASA per his heart doctor. He will return for f/u and labs with Dr. Carlis Abbott in 3 months. No further questions/concerns.

## 2019-05-24 ENCOUNTER — Encounter (INDEPENDENT_AMBULATORY_CARE_PROVIDER_SITE_OTHER): Payer: Medicare Other | Admitting: Ophthalmology

## 2019-06-21 DEATH — deceased

## 2019-08-15 ENCOUNTER — Telehealth: Payer: Self-pay | Admitting: Family Medicine
